# Patient Record
Sex: Male | Born: 1965 | Race: White | Hispanic: No | Marital: Married | Smoking: Current every day smoker
Health system: Southern US, Community
[De-identification: ages and names within clinical notes are randomized; demographics above are authoritative.]

## PROBLEM LIST (undated history)

## (undated) DIAGNOSIS — F329 Major depressive disorder, single episode, unspecified: Secondary | ICD-10-CM

## (undated) DIAGNOSIS — IMO0002 Reserved for concepts with insufficient information to code with codable children: Secondary | ICD-10-CM

## (undated) DIAGNOSIS — F419 Anxiety disorder, unspecified: Secondary | ICD-10-CM

## (undated) DIAGNOSIS — K08109 Complete loss of teeth, unspecified cause, unspecified class: Secondary | ICD-10-CM

## (undated) DIAGNOSIS — K219 Gastro-esophageal reflux disease without esophagitis: Secondary | ICD-10-CM

## (undated) DIAGNOSIS — J45909 Unspecified asthma, uncomplicated: Secondary | ICD-10-CM

## (undated) DIAGNOSIS — F32A Depression, unspecified: Secondary | ICD-10-CM

## (undated) DIAGNOSIS — M1990 Unspecified osteoarthritis, unspecified site: Secondary | ICD-10-CM

## (undated) DIAGNOSIS — Z972 Presence of dental prosthetic device (complete) (partial): Secondary | ICD-10-CM

## (undated) DIAGNOSIS — Z9884 Bariatric surgery status: Secondary | ICD-10-CM

## (undated) DIAGNOSIS — I10 Essential (primary) hypertension: Secondary | ICD-10-CM

## (undated) DIAGNOSIS — F17200 Nicotine dependence, unspecified, uncomplicated: Secondary | ICD-10-CM

## (undated) HISTORY — PX: MULTIPLE TOOTH EXTRACTIONS: SHX2053

## (undated) HISTORY — PX: GASTRIC BYPASS: SHX52

## (undated) HISTORY — PX: CARPAL TUNNEL RELEASE: SHX101

## (undated) HISTORY — PX: ROTATOR CUFF REPAIR: SHX139

---

## 1998-05-15 ENCOUNTER — Emergency Department (HOSPITAL_COMMUNITY): Admission: EM | Admit: 1998-05-15 | Discharge: 1998-05-15 | Payer: Self-pay | Admitting: Emergency Medicine

## 1998-06-21 ENCOUNTER — Encounter: Payer: Self-pay | Admitting: Emergency Medicine

## 1998-06-21 ENCOUNTER — Emergency Department (HOSPITAL_COMMUNITY): Admission: EM | Admit: 1998-06-21 | Discharge: 1998-06-21 | Payer: Self-pay | Admitting: Emergency Medicine

## 1998-06-24 ENCOUNTER — Encounter: Admission: RE | Admit: 1998-06-24 | Discharge: 1998-09-22 | Payer: Self-pay | Admitting: Internal Medicine

## 1998-06-26 ENCOUNTER — Encounter: Admission: RE | Admit: 1998-06-26 | Discharge: 1998-06-26 | Payer: Self-pay | Admitting: *Deleted

## 1998-08-09 ENCOUNTER — Emergency Department (HOSPITAL_COMMUNITY): Admission: EM | Admit: 1998-08-09 | Discharge: 1998-08-09 | Payer: Self-pay | Admitting: Emergency Medicine

## 1998-09-16 ENCOUNTER — Inpatient Hospital Stay (HOSPITAL_COMMUNITY): Admission: EM | Admit: 1998-09-16 | Discharge: 1998-09-18 | Payer: Self-pay | Admitting: Endocrinology

## 1998-09-16 ENCOUNTER — Encounter: Payer: Self-pay | Admitting: Endocrinology

## 1999-01-21 ENCOUNTER — Encounter: Payer: Self-pay | Admitting: Emergency Medicine

## 1999-01-21 ENCOUNTER — Emergency Department (HOSPITAL_COMMUNITY): Admission: EM | Admit: 1999-01-21 | Discharge: 1999-01-21 | Payer: Self-pay | Admitting: Internal Medicine

## 1999-01-22 ENCOUNTER — Inpatient Hospital Stay (HOSPITAL_COMMUNITY): Admission: EM | Admit: 1999-01-22 | Discharge: 1999-01-24 | Payer: Self-pay | Admitting: Emergency Medicine

## 1999-04-10 ENCOUNTER — Emergency Department (HOSPITAL_COMMUNITY): Admission: EM | Admit: 1999-04-10 | Discharge: 1999-04-10 | Payer: Self-pay | Admitting: Emergency Medicine

## 1999-04-10 ENCOUNTER — Encounter: Payer: Self-pay | Admitting: Emergency Medicine

## 1999-04-11 ENCOUNTER — Emergency Department (HOSPITAL_COMMUNITY): Admission: EM | Admit: 1999-04-11 | Discharge: 1999-04-11 | Payer: Self-pay | Admitting: Emergency Medicine

## 1999-04-27 ENCOUNTER — Emergency Department (HOSPITAL_COMMUNITY): Admission: EM | Admit: 1999-04-27 | Discharge: 1999-04-27 | Payer: Self-pay | Admitting: Emergency Medicine

## 1999-09-22 ENCOUNTER — Encounter: Payer: Self-pay | Admitting: Internal Medicine

## 1999-09-22 ENCOUNTER — Emergency Department (HOSPITAL_COMMUNITY): Admission: EM | Admit: 1999-09-22 | Discharge: 1999-09-22 | Payer: Self-pay | Admitting: Internal Medicine

## 2000-05-05 ENCOUNTER — Emergency Department (HOSPITAL_COMMUNITY): Admission: EM | Admit: 2000-05-05 | Discharge: 2000-05-05 | Payer: Self-pay | Admitting: Emergency Medicine

## 2000-05-17 ENCOUNTER — Encounter: Payer: Self-pay | Admitting: Emergency Medicine

## 2000-05-17 ENCOUNTER — Emergency Department (HOSPITAL_COMMUNITY): Admission: EM | Admit: 2000-05-17 | Discharge: 2000-05-17 | Payer: Self-pay | Admitting: Emergency Medicine

## 2000-12-19 ENCOUNTER — Emergency Department (HOSPITAL_COMMUNITY): Admission: EM | Admit: 2000-12-19 | Discharge: 2000-12-19 | Payer: Self-pay | Admitting: Emergency Medicine

## 2001-02-12 ENCOUNTER — Emergency Department (HOSPITAL_COMMUNITY): Admission: EM | Admit: 2001-02-12 | Discharge: 2001-02-13 | Payer: Self-pay | Admitting: Emergency Medicine

## 2001-02-13 ENCOUNTER — Encounter: Payer: Self-pay | Admitting: Emergency Medicine

## 2002-02-25 ENCOUNTER — Emergency Department (HOSPITAL_COMMUNITY): Admission: EM | Admit: 2002-02-25 | Discharge: 2002-02-25 | Payer: Self-pay | Admitting: Emergency Medicine

## 2002-06-30 ENCOUNTER — Emergency Department (HOSPITAL_COMMUNITY): Admission: EM | Admit: 2002-06-30 | Discharge: 2002-06-30 | Payer: Self-pay | Admitting: Emergency Medicine

## 2003-10-06 HISTORY — PX: SHOULDER ARTHROSCOPY: SHX128

## 2004-03-28 ENCOUNTER — Encounter: Admission: RE | Admit: 2004-03-28 | Discharge: 2004-03-28 | Payer: Self-pay | Admitting: Neurosurgery

## 2004-03-28 ENCOUNTER — Ambulatory Visit (HOSPITAL_COMMUNITY): Admission: RE | Admit: 2004-03-28 | Discharge: 2004-03-28 | Payer: Self-pay | Admitting: Neurosurgery

## 2004-04-21 ENCOUNTER — Emergency Department (HOSPITAL_COMMUNITY): Admission: EM | Admit: 2004-04-21 | Discharge: 2004-04-21 | Payer: Self-pay | Admitting: Emergency Medicine

## 2004-05-21 ENCOUNTER — Ambulatory Visit (HOSPITAL_COMMUNITY): Admission: RE | Admit: 2004-05-21 | Discharge: 2004-05-22 | Payer: Self-pay | Admitting: Orthopedic Surgery

## 2004-10-05 HISTORY — PX: SKIN GRAFT FULL THICKNESS LEG: SUR1299

## 2004-10-05 HISTORY — PX: GASTRIC BYPASS: SHX52

## 2004-10-05 HISTORY — PX: CARPAL TUNNEL RELEASE: SHX101

## 2005-01-27 ENCOUNTER — Encounter: Admission: RE | Admit: 2005-01-27 | Discharge: 2005-04-27 | Payer: Self-pay | Admitting: *Deleted

## 2005-01-28 ENCOUNTER — Ambulatory Visit: Payer: Self-pay | Admitting: Psychology

## 2005-02-16 ENCOUNTER — Ambulatory Visit: Payer: Self-pay | Admitting: Psychology

## 2005-02-18 ENCOUNTER — Ambulatory Visit (HOSPITAL_COMMUNITY): Admission: RE | Admit: 2005-02-18 | Discharge: 2005-02-18 | Payer: Self-pay | Admitting: *Deleted

## 2005-02-20 ENCOUNTER — Ambulatory Visit: Payer: Self-pay | Admitting: Psychology

## 2005-03-18 ENCOUNTER — Ambulatory Visit (HOSPITAL_BASED_OUTPATIENT_CLINIC_OR_DEPARTMENT_OTHER): Admission: RE | Admit: 2005-03-18 | Discharge: 2005-03-18 | Payer: Self-pay | Admitting: Orthopedic Surgery

## 2005-03-18 ENCOUNTER — Ambulatory Visit (HOSPITAL_COMMUNITY): Admission: RE | Admit: 2005-03-18 | Discharge: 2005-03-18 | Payer: Self-pay | Admitting: Orthopedic Surgery

## 2005-03-25 ENCOUNTER — Ambulatory Visit: Payer: Self-pay | Admitting: Psychology

## 2005-04-28 ENCOUNTER — Encounter: Admission: RE | Admit: 2005-04-28 | Discharge: 2005-07-27 | Payer: Self-pay | Admitting: *Deleted

## 2005-05-12 ENCOUNTER — Inpatient Hospital Stay (HOSPITAL_COMMUNITY): Admission: RE | Admit: 2005-05-12 | Discharge: 2005-05-15 | Payer: Self-pay | Admitting: *Deleted

## 2005-05-16 ENCOUNTER — Inpatient Hospital Stay (HOSPITAL_COMMUNITY): Admission: EM | Admit: 2005-05-16 | Discharge: 2005-05-18 | Payer: Self-pay | Admitting: Emergency Medicine

## 2005-07-19 ENCOUNTER — Emergency Department (HOSPITAL_COMMUNITY): Admission: EM | Admit: 2005-07-19 | Discharge: 2005-07-19 | Payer: Self-pay | Admitting: Emergency Medicine

## 2005-07-31 ENCOUNTER — Inpatient Hospital Stay (HOSPITAL_COMMUNITY): Admission: RE | Admit: 2005-07-31 | Discharge: 2005-08-06 | Payer: Self-pay | Admitting: Plastic Surgery

## 2005-08-19 ENCOUNTER — Encounter: Admission: RE | Admit: 2005-08-19 | Discharge: 2005-11-17 | Payer: Self-pay | Admitting: *Deleted

## 2005-12-16 ENCOUNTER — Emergency Department (HOSPITAL_COMMUNITY): Admission: EM | Admit: 2005-12-16 | Discharge: 2005-12-17 | Payer: Self-pay | Admitting: Emergency Medicine

## 2005-12-25 ENCOUNTER — Emergency Department (HOSPITAL_COMMUNITY): Admission: EM | Admit: 2005-12-25 | Discharge: 2005-12-25 | Payer: Self-pay | Admitting: Emergency Medicine

## 2006-02-13 ENCOUNTER — Emergency Department (HOSPITAL_COMMUNITY): Admission: EM | Admit: 2006-02-13 | Discharge: 2006-02-13 | Payer: Self-pay | Admitting: Emergency Medicine

## 2006-03-14 ENCOUNTER — Emergency Department (HOSPITAL_COMMUNITY): Admission: EM | Admit: 2006-03-14 | Discharge: 2006-03-15 | Payer: Self-pay | Admitting: Emergency Medicine

## 2007-06-18 ENCOUNTER — Inpatient Hospital Stay (HOSPITAL_COMMUNITY): Admission: AD | Admit: 2007-06-18 | Discharge: 2007-06-22 | Payer: Self-pay | Admitting: Psychiatry

## 2007-06-20 ENCOUNTER — Ambulatory Visit: Payer: Self-pay | Admitting: Psychiatry

## 2007-08-04 ENCOUNTER — Emergency Department (HOSPITAL_COMMUNITY): Admission: EM | Admit: 2007-08-04 | Discharge: 2007-08-04 | Payer: Self-pay | Admitting: Emergency Medicine

## 2008-10-05 HISTORY — PX: CARPAL TUNNEL RELEASE: SHX101

## 2008-11-06 ENCOUNTER — Ambulatory Visit (HOSPITAL_BASED_OUTPATIENT_CLINIC_OR_DEPARTMENT_OTHER): Admission: RE | Admit: 2008-11-06 | Discharge: 2008-11-06 | Payer: Self-pay | Admitting: Orthopedic Surgery

## 2009-03-29 ENCOUNTER — Emergency Department (HOSPITAL_COMMUNITY): Admission: EM | Admit: 2009-03-29 | Discharge: 2009-03-29 | Payer: Self-pay | Admitting: Emergency Medicine

## 2010-10-05 HISTORY — PX: SHOULDER ARTHROSCOPY: SHX128

## 2011-01-12 LAB — BASIC METABOLIC PANEL
BUN: 6 mg/dL (ref 6–23)
Calcium: 8.8 mg/dL (ref 8.4–10.5)
Creatinine, Ser: 0.76 mg/dL (ref 0.4–1.5)
GFR calc non Af Amer: >60 mL/min (ref >60)
Glucose, Bld: 183 mg/dL — ABNORMAL HIGH (ref 70–99)
Potassium: 3.2 mEq/L — ABNORMAL LOW (ref 3.5–5.1)

## 2011-01-12 LAB — DIFFERENTIAL
Basophils Absolute: 0 10*3/uL (ref 0.0–0.1)
Eosinophils Relative: 1 % (ref 0–5)
Lymphocytes Relative: 13 % (ref 12–46)
Lymphs Abs: 1.2 10*3/uL (ref 0.7–4.0)
Neutro Abs: 6.9 10*3/uL (ref 1.7–7.7)
Neutrophils Relative %: 79 % — ABNORMAL HIGH (ref 43–77)

## 2011-01-12 LAB — URINALYSIS, ROUTINE W REFLEX MICROSCOPIC
Bilirubin Urine: NEGATIVE
Ketones, ur: NEGATIVE mg/dL
Nitrite: NEGATIVE
Urobilinogen, UA: 0.2 mg/dL (ref 0.0–1.0)
pH: 5.5 (ref 5.0–8.0)

## 2011-01-12 LAB — D-DIMER, QUANTITATIVE: D-Dimer, Quant: 0.9 ug/mL-FEU — ABNORMAL HIGH (ref 0.00–0.48)

## 2011-01-12 LAB — HEPATIC FUNCTION PANEL
ALT: 23 U/L (ref 0–53)
Albumin: 3.7 g/dL (ref 3.5–5.2)
Alkaline Phosphatase: 106 U/L (ref 39–117)
Bilirubin, Direct: 0.1 mg/dL (ref 0.0–0.3)
Total Protein: 6.6 g/dL (ref 6.0–8.3)

## 2011-01-12 LAB — CBC
HCT: 42.7 % (ref 39.0–52.0)
Platelets: 299 10*3/uL (ref 150–400)
RDW: 12.1 % (ref 11.5–15.5)
WBC: 8.7 10*3/uL (ref 4.0–10.5)

## 2011-01-12 LAB — TROPONIN I: Troponin I: 0.02 ng/mL (ref 0.00–0.06)

## 2011-01-12 LAB — LIPASE, BLOOD: Lipase: 20 U/L (ref 11–59)

## 2011-01-20 LAB — GLUCOSE, CAPILLARY
Glucose-Capillary: 223 mg/dL — ABNORMAL HIGH (ref 70–99)
Glucose-Capillary: 247 mg/dL — ABNORMAL HIGH (ref 70–99)

## 2011-01-20 LAB — BASIC METABOLIC PANEL
BUN: 14 mg/dL (ref 6–23)
Chloride: 94 mEq/L — ABNORMAL LOW (ref 96–112)
Potassium: 4.2 mEq/L (ref 3.5–5.1)
Sodium: 133 mEq/L — ABNORMAL LOW (ref 135–145)

## 2011-01-31 ENCOUNTER — Emergency Department (HOSPITAL_COMMUNITY): Payer: Self-pay

## 2011-01-31 ENCOUNTER — Emergency Department (HOSPITAL_COMMUNITY)
Admission: EM | Admit: 2011-01-31 | Discharge: 2011-01-31 | Disposition: A | Discharge status: Home / Self Care | Payer: Self-pay | Attending: Emergency Medicine | Admitting: Emergency Medicine

## 2011-01-31 DIAGNOSIS — R110 Nausea: Secondary | ICD-10-CM | POA: Insufficient documentation

## 2011-01-31 DIAGNOSIS — G8929 Other chronic pain: Secondary | ICD-10-CM | POA: Insufficient documentation

## 2011-01-31 DIAGNOSIS — Z79899 Other long term (current) drug therapy: Secondary | ICD-10-CM | POA: Insufficient documentation

## 2011-01-31 DIAGNOSIS — I10 Essential (primary) hypertension: Secondary | ICD-10-CM | POA: Insufficient documentation

## 2011-01-31 DIAGNOSIS — R10819 Abdominal tenderness, unspecified site: Secondary | ICD-10-CM | POA: Insufficient documentation

## 2011-01-31 DIAGNOSIS — Z9884 Bariatric surgery status: Secondary | ICD-10-CM | POA: Insufficient documentation

## 2011-01-31 DIAGNOSIS — R1013 Epigastric pain: Secondary | ICD-10-CM | POA: Insufficient documentation

## 2011-01-31 LAB — URINALYSIS, ROUTINE W REFLEX MICROSCOPIC
Bilirubin Urine: NEGATIVE
Glucose, UA: NEGATIVE mg/dL
Hgb urine dipstick: NEGATIVE
Ketones, ur: NEGATIVE mg/dL
Nitrite: NEGATIVE
Protein, ur: NEGATIVE mg/dL
Specific Gravity, Urine: 1.023 (ref 1.005–1.030)
Urobilinogen, UA: 0.2 mg/dL (ref 0.0–1.0)
pH: 5.5 (ref 5.0–8.0)

## 2011-01-31 LAB — COMPREHENSIVE METABOLIC PANEL
CO2: 29 mEq/L (ref 19–32)
Calcium: 8.6 mg/dL (ref 8.4–10.5)
Creatinine, Ser: 0.94 mg/dL (ref 0.4–1.5)
GFR calc Af Amer: >60 mL/min (ref >60)
GFR calc non Af Amer: >60 mL/min (ref >60)
Glucose, Bld: 111 mg/dL — ABNORMAL HIGH (ref 70–99)
Sodium: 141 mEq/L (ref 135–145)
Total Protein: 6.3 g/dL (ref 6.0–8.3)

## 2011-01-31 LAB — CBC
HCT: 38.7 % — ABNORMAL LOW (ref 39.0–52.0)
Hemoglobin: 13.9 g/dL (ref 13.0–17.0)
MCH: 31.5 pg (ref 26.0–34.0)
MCHC: 35.9 g/dL (ref 30.0–36.0)
MCV: 87.8 fL (ref 78.0–100.0)
Platelets: 280 10*3/uL (ref 150–400)
RBC: 4.41 MIL/uL (ref 4.22–5.81)
RDW: 11.9 % (ref 11.5–15.5)
WBC: 9.4 10*3/uL (ref 4.0–10.5)

## 2011-01-31 LAB — DIFFERENTIAL
Basophils Absolute: 0.1 10*3/uL (ref 0.0–0.1)
Basophils Relative: 1 % (ref 0–1)
Eosinophils Absolute: 0.2 10*3/uL (ref 0.0–0.7)
Eosinophils Relative: 2 % (ref 0–5)
Lymphocytes Relative: 24 % (ref 12–46)
Lymphs Abs: 2.3 K/uL (ref 0.7–4.0)
Monocytes Absolute: 0.5 10*3/uL (ref 0.1–1.0)
Monocytes Relative: 5 % (ref 3–12)
Neutro Abs: 6.5 K/uL (ref 1.7–7.7)
Neutrophils Relative %: 69 % (ref 43–77)

## 2011-01-31 LAB — OCCULT BLOOD, POC DEVICE: Fecal Occult Bld: NEGATIVE

## 2011-01-31 LAB — COMPREHENSIVE METABOLIC PANEL WITH GFR
ALT: 18 U/L (ref 0–53)
AST: 16 U/L (ref 0–37)
Albumin: 3.4 g/dL — ABNORMAL LOW (ref 3.5–5.2)
Alkaline Phosphatase: 69 U/L (ref 39–117)
BUN: 14 mg/dL (ref 6–23)
Chloride: 105 meq/L (ref 96–112)
Potassium: 3.9 meq/L (ref 3.5–5.1)
Total Bilirubin: 0.5 mg/dL (ref 0.3–1.2)

## 2011-01-31 LAB — LIPASE, BLOOD: Lipase: 54 U/L (ref 11–59)

## 2011-02-17 NOTE — Discharge Summary (Signed)
Timothy [name], Timothy [name]               ACCOUNT NO.:  [id number]   MEDICAL RECORD NO.:  [id number]          PATIENT TYPE:  IPS   LOCATION:  0503                          FACILITY:  [facility]   PHYSICIAN:  Geoffery Lyons, M.D.      DATE OF BIRTH:  [birth year]   DATE OF ADMISSION:  06/18/2007  DATE OF DISCHARGE:  06/22/2007                               DISCHARGE SUMMARY   CHIEF COMPLAINT AND PRESENT ILLNESS:  This was the first admission to  [facility] for this 45 year old married white male  presented as walk-in requesting detox from alcohol.  He went to his  family physician and he was encouraged to go to the [facility] ED on Friday  after he had stopped drinking alcohol on Thursday.  After a lengthy  evaluation in the ED, he was told that he did not actually meet the  inpatient criteria and was discharged.  The day before this admission,  he felt woozy, afraid he was going to have a seizure.  He went and  grabbed a beer and came to [facility].   PAST PSYCHIATRIC HISTORY:  No prior inpatient treatment.  Has one  outpatient visit at [facility].   ALCOHOL/DRUG HISTORY:  Began using alcohol at age 27.  This escalated  after his gastric bypass in 2006.  He had a tequila at a party.  He  realized it was an instant rush and worked himself up to about a fifth  of tequila a day.  Back in January, realized that his alcohol was  problematic.  Was afraid of being fired and hence he went to beer,  drinking 14-24 12-ounce beers a day.  He had a DUI back in April.   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   MEDICATIONS:  Lexapro 20 mg per day, furosemide 80 mg per day,  metoprolol ER 50 mg in the morning and at night, Singulair 10 mg per  day, ACTOplus met 15/500 mg twice a day, alprazolam ER 1 mg twice a day  and 0.5 mg twice a day as needed.   PHYSICAL  EXAMINATION:  Performed and failed to show any acute findings.   LABORATORY DATA:  White blood cells 4.5, hemoglobin 13.6.  Sodium 142,  potassium 3.8, glucose 116.  SGOT 26, SGPT 23, total bilirubin 0.6.   MENTAL STATUS EXAM:  Alert cooperative male, appropriately groomed,  dressed and nourished.  Good eye contact.  Speech was normal rate,  rhythm and production.  Mood was anxious.  Affect was broad.  Thought  processes are clear, rational and goal-oriented.  No delusions.  No  active suicidal or homicidal ideation.  No hallucinations.  Cognition  well-preserved.   ADMISSION DIAGNOSES:  AXIS I:  Alcohol dependence.  Alcohol withdrawal.  Anxiety disorder not otherwise specified.  Rule out depressive disorder  not otherwise specified.  AXIS II:  No diagnosis.  AXIS III:  Non-insulin-dependent diabetes mellitus, asthma,  hypertension, palpitations, ruptured C6-C7, resulting in chronic neck  pain and  scoliosis and tremor.  AXIS IV:  Moderate.  AXIS V:  GAF upon admission 35; highest GAF in the last year 60.   HOSPITAL COURSE:  He was admitted.  He was started in individual and  group psychotherapy.  He was detoxified with Librium.  He was maintained  on the Lexapro 20 mg per day, furosemide 80 mg per day, metoprolol ER  100 mg per day, Singulair 10 mg per day, ACTOplus met 15/850 mg twice a  day.  As already stated, he endorsed that he started drinking in 2006  after he went through a gastric bypass surgery.  Started using tequila.  It got to him and he felt it and liked it.  Increased tolerance,  switched to beer, 24 beers per day.  Was on Xanax and was drinking with  it.  He was also using some opiates.  Says life has become his going to  work, get home, drink, pass out, then get up in the morning, work,  drink, pass out.  Confronted by his wife who basically told him that she  was not going to put up with him doing this over again.  He also had a  DUI in April.  He also endorsed  panic attacks there even when he was not  drinking.  Detox went uneventfully.  Continued to endorse that he was  committed to abstinence.  Still underlying depression.  Requested an  adjustment on the Lexapro which was reasonable.  Was sleeping with the  Ambien.  Endorsed a restful sleep.  On June 22, 2007, he was in  full contact with reality.  There were no active suicidal or homicidal  ideation.  No hallucinations.  No delusions.  He was willing and  motivated to pursue further outpatient treatment for what we went ahead  and discharged to outpatient follow-up.   DISCHARGE DIAGNOSES:  AXIS I:  Alcohol dependence.  Anxiety disorder not  otherwise specified.  Depressive disorder not otherwise specified.  AXIS II:  No diagnosis.  AXIS III:  Non-insulin-dependent diabetes mellitus, asthma,  hypertension, palpitations, ruptured C6-C7, chronic neck pain,  scoliosis.  AXIS IV:  Moderate.  AXIS V:  GAF upon discharge 55-60.   DISCHARGE MEDICATIONS:  1. Lexapro 30 mg per day.  2. Lasix 80 mg per day.  3. Singulair 10 mg at bedtime.  4. Toprol XL 50 mg twice a day.  5. Aspirin 81 mg per day.  6. Voltaren 75 mg twice a day.  7. Ambien 10 mg at bedtime for sleep.  8. ACTOplus met 15/850 mg, 1 twice a day.   FOLLOW UP:  Dr. Lolly Mustache at [facility].      Geoffery Lyons, M.D.  Electronically Signed     IL/MEDQ  D:  07/12/2007  T:  07/13/2007  Job:  [id number]

## 2011-02-17 NOTE — H&P (Signed)
NAMEGRAYDEN, Timothy [name]               ACCOUNT NO.:  [id number]   MEDICAL RECORD NO.:  [id number]          PATIENT TYPE:  IPS   LOCATION:  0503                          [facility]ACILITY[facility]  [facility]   PHYSICIAN:  Geoffery Lyons, M.D.      DATE OF BIRTH:  11/26/1965   DATE OF ADMISSION:  06/18/2007  DATE OF DISCHARGE:                       PSYCHIATRIC ADMISSION ASSESSMENT   IDENTIFYING INFORMATION:  This is a voluntary admission to the services  of Dr. Geoffery Lyons.  This is a 45 year old married white male.  Mr.  Thrall presented as a walk-in yesterday requesting detox from alcohol.  He stated he went to his family physician, Dr. Dartha Lodge, on  Wednesday and he was encouraged to go to the [facility] ED on Friday after  he had stopped drinking alcohol on Thursday.  However, after a lengthy  evaluation in the emergency department, he was told that he did not  actually meet their inpatient criteria and was discharged.  Yesterday,  he felt woozy.  He was afraid that he would have a seizure and, hence,  he went and grabbed a beer and then came on here to the evaluation  center here at [facility].  He was admitted.  He was  started on the low dose Librium protocol.  Today, he states he feels a  lot better although he is still having some hand tremor.   PAST PSYCHIATRIC HISTORY:  He has had no prior inpatient.  He had one  outpatient visit at [facility].   SOCIAL HISTORY:  He has a B.S. in emergency medicine.  This is his  second marriage.  It has lasted 13 years.  They have no children.  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   FAMILY HISTORY:  He has a cousin who suicided by gunshot wound and  maternal uncles have alcohol issues as well as his father and he also  has a cousin who uses alcohol and crack.   ALCOHOL/DRUG HISTORY:  He began using alcohol at age 34.  However, this  escalated after his gastric bypass in 2006.  He had tequila at a  party,  realized it was an instant rush and worked himself up to about a fifth  of tequila a day.  Back in January, he realized that his alcohol use was  problematic.  He was afraid of being fired and hence he went to beer.  He has been drinking 14-24 12-ounce beers a day and did indeed have a  DUI back in April.  His court date is upcoming for that.   PRIMARY CARE PHYSICIAN:  Dr. Dartha Lodge.   MEDICAL PROBLEMS:  Non-insulin-dependent diabetes mellitus, asthma,  hypertension, palpitations, chronic neck pain due to ruptured C6-7 and  scoliosis.   MEDICATIONS:  He is currently prescribed Lexapro 20 mg p.o. q.d.,  furosemide 80 mg p.o. q.d., metoprolol ER 50 mg a.m. and h.s., Singulair  10 mg p.o. q.d., ACTOplus ET 15/500 mg b.i.d., alprazolam ER at 1 mg  p.o. b.i.d. and alprazolam 0.5 mg p.o. b.i.d. p.r.n.   ALLERGIES:  PENICILLIN,  IVP DYE and SULFA.   POSITIVE PHYSICAL FINDINGS:  He has managed to lose 198 pounds since his  gastric surgery bypass.   LABORATORY DATA:  Not all of his labs are available yet.  His glucose is  still slightly elevated at 116.   The remainder of his physical examination is unremarkable.  He is 5 feet  9 inches, weighs 201 pounds, temperature is 98.1, blood pressure 139/86,  pulse 89, respirations 22.  He does have some slight hand tremor, right  hand greater than left.  He states that he always has some right tremor.  This is due to his ruptured disk.   MENTAL STATUS EXAM:  Today, he is alert and oriented x3.  He is  appropriately groomed, dressed and nourished.  He has good eye contact.  His speech is a normal rate, rhythm and tone.  His mood is appropriate  to the situation.  His affect has a normal range.  Thought processes are  clear, rational and goal-oriented.  Judgment and insight are intact.  Concentration and memory are intact.  Intelligence is at least average.  He denies suicidal or homicidal ideation.  He denies auditory or visual   hallucinations.   DIAGNOSES:  AXIS I:  Alcohol dependence.  Alcohol withdrawal.  Anxiety  disorder not otherwise specified.  Depressive disorder not otherwise  specified.  AXIS II:  Deferred.  AXIS III:  Non-insulin-dependent diabetes mellitus, asthma,  hypertension, palpitations, ruptured C6-7 resulting in chronic neck  pain, scoliosis, tremor.  AXIS IV:  Legal, he is status post DUI April of 2008 with an upcoming  court date July 12, 2007.  AXIS V:  40.   PLAN:  To admit for detoxification from alcohol.  Toward that end, he  was started on the low-dose Librium protocol.  He stated that he had  actually had a discussion regarding Antabuse with his primary care  physician and we will leave that to the attending psychiatrist to see if  they want to start that or not while he is hospitalized.      Mickie Leonarda Salon, P.A.-C.      Geoffery Lyons, M.D.  Electronically Signed    MD/MEDQ  D:  06/19/2007  T:  06/19/2007  Job:  [id number]

## 2011-02-17 NOTE — Op Note (Signed)
Timothy [name], ELPERS               ACCOUNT NO.:  [id number]   MEDICAL RECORD NO.:  [id number]          PATIENT TYPE:  AMB   LOCATION:  DSC                          FACILITY:  [facility]   PHYSICIAN:  [name], M.D.       DATE OF BIRTH:  1965/12/18   DATE OF PROCEDURE:  11/06/2008  DATE OF DISCHARGE:                               OPERATIVE REPORT   PREOPERATIVE DIAGNOSIS:  Carpal tunnel syndrome, right hand.   POSTOPERATIVE DIAGNOSIS:  Carpal tunnel syndrome, right hand.   OPERATION:  Decompression, right median nerve.   SURGEON:  [name], MD   ASSISTANT:  Joaquin Courts, RN   ANESTHESIA:  Forearm based IV regional.   ANESTHESIOLOGIST:  Sheldon Silvan, MD   HISTORY:  The patient is a 45 year old male with a history of carpal  tunnel syndrome, EMG nerve conductions positive which has not responded  to conservative treatment.  He has elected to undergo surgical  decompression.  Pre, peri, and postoperative course were discussed along  with risks and complications.  He is aware that there is no guarantee  with the surgery, possibility of infection, recurrence of injury to  arteries, nerves, tendons, complete relief of symptoms, or dystrophy.  In the preoperative area, the patient is seen, the extremity marked by  both the patient and surgeon, antibiotic given, and questions were  answered to his satisfaction.   PROCEDURE IN DETAIL:  The patient was brought to the operating room  where a forearm based IV regional anesthetic was carried out without  difficulty.  He was prepped using DuraPrep, supine position, right arm  free.  He had some movement.  Time-out was taken.  An incision was made.  He continued to show movement.  The area was infiltrated with 0.25%  Marcaine without epinephrine.  The incision was deepened down to the  palmar fascia.  This was split.  The superficial palmar arch was  identified.  Flexor tendon of the ring and little finger identified to  the ulnar side of  median nerve.  The carpal retinaculum was incised with  sharp dissection.  Right angle and Sewall retractor were placed between  skin and forearm fascia.  The fascia was released for approximately 1.5  cm proximal to the wrist crease under direct vision.  Canal was  explored.  The area of compression of the nerve was apparent and no  further lesions were identified.  The wound was irrigated.  Skin closed  with interrupted 5-0 Vicryl Rapide  sutures.  A sterile compressive dressing with splint to the wrist was  applied.  The patient tolerated the procedure well and was taken to the  recovery room for observation in satisfactory condition.  He will be  discharged home to return to the [facility] in 1 week on  Vicodin.           ______________________________  [name], M.D.     GK/MEDQ  D:  11/06/2008  T:  11/06/2008  Job:  [id number]   cc:   [facility]

## 2011-02-20 NOTE — Op Note (Signed)
NAMEDAQUARIUS, DUBEAU               ACCOUNT NO.:  [id number]   MEDICAL RECORD NO.:  [id number]          PATIENT TYPE:  INP   LOCATION:  0002                         [facility]  [facility]   PHYSICIAN:  Vikki Ports, MDDATE OF BIRTH:  25-Dec-1965   DATE OF PROCEDURE:  05/12/2005  DATE OF DISCHARGE:                                 OPERATIVE REPORT   PREOPERATIVE DIAGNOSIS:  Morbid obesity with multiple comorbid conditions.   POSTOPERATIVE DIAGNOSIS:  Morbid obesity with multiple comorbid conditions.   PROCEDURE:  Laparoscopic Roux-en-Y gastric bypass, left-facing Roux limb.   SURGEON:  Vikki Ports, M.D.   ASSISTANT:  Sharlet Salina T. Hoxworth, M.D.   DESCRIPTION:  The patient was taken to the operating room and placed in a  supine position.  After adequate general anesthesia was induced using  endotracheal tube, the abdomen was prepped and draped in a normal sterile  fashion.  Using an 11 mm Optiview technique, peritoneal access was obtained  in the left upper quadrant.  The additional trocars were placed under direct  vision throughout the abdominal cavity.  The ligament of Treitz was  identified, and the proximal jejunum was transected using a 60 mm GIA  stapling device 40 cm distal to the ligament of Treitz.  I then counted 100  cm after marking the Roux limb with a Penrose drain, and a side-to-side  jejunojejunostomy was performed in a standard fashion.  The mesenteric  defect was closed with a running 2-0 silk suture.  DICTATION ENDED AT THIS  POINT       KRH/MEDQ  D:  05/12/2005  T:  05/12/2005  Job:  [id number]

## 2011-02-20 NOTE — Op Note (Signed)
NAMEROSEMARY, Timothy [name]               ACCOUNT NO.:  [id number]   MEDICAL RECORD NO.:  [id number]          PATIENT TYPE:  INP   LOCATION:  5705                         FACILITY:  [facility]   PHYSICIAN:  Etter Sjogren, M.D.     DATE OF BIRTH:  [birth year]   DATE OF PROCEDURE:  07/31/2005  DATE OF DISCHARGE:                                 OPERATIVE REPORT   PREOPERATIVE DIAGNOSIS:  Complicated open wound of the left lower leg.   POSTOPERATIVE DIAGNOSIS:  Complicated open wound of the left lower leg.   PROCEDURE PERFORMED:  1.  Preparation of recipient site as a separate procedure.  2.  Split thickness skin graft, left leg.  3.  Placement of a VAC assistive closure device as a separate distinct      procedure.   SURGEON:  Etter Sjogren, M.D.   ANESTHESIA:  General.   ESTIMATED BLOOD LOSS:  Minimal.   CLINICAL NOTE:  45 year old man has had dog bite injuries to his left leg.  These have improved but he still has a fairly large open wound of his left  leg that requires skin grafting.  The nature of the procedure and risks were  discussed with him including the possibility of failure of the graft.  He is  at increased risk due to his weight as well as his diabetes.  He understands  the risks and possible complications and wishes to proceed.   DESCRIPTION OF PROCEDURE:  The patient was taken to the operating room and  placed supine.  After successful induction of anesthesia, he was prepped  with Betadine and draped with sterile drapes.  The wound was then debrided  very carefully and irrigated with 3 liters of saline using the pulse lavage  system.  The split thickness skin graft was then harvested from the right  thigh and meshed 1.5 to 1.  It was applied to the wound bed and secured with  skin staples.  A dressing with Adaptic and a VAC closure device was placed.  The donor site was dressed with Xeroform gauze, Adaptic, 4 by 4s, ABD.  He  was transferred to the recovery room in stable  condition having tolerated  the procedure well.      Etter Sjogren, M.D.  Electronically Signed     DB/MEDQ  D:  07/31/2005  T:  07/31/2005  Job:  [id number]

## 2011-02-20 NOTE — Discharge Summary (Signed)
NAMEDINH, AYOTTE               ACCOUNT NO.:  [id number]   MEDICAL RECORD NO.:  [id number]          PATIENT TYPE:  INP   LOCATION:  5704                         FACILITY:  [facility]   PHYSICIAN:  Etter Sjogren, M.D.     DATE OF BIRTH:  Sep 01, 1966   DATE OF ADMISSION:  07/31/2005  DATE OF DISCHARGE:                                 DISCHARGE SUMMARY   FINAL DIAGNOSIS:  Complicated wound of the left leg.   PROCEDURE PERFORMED:  Preparation of the site and split thickness skin  grafting as well as application of a VAC.   SUMMARY OF HISTORY AND PHYSICAL:  This is a 45 year old man who was a bitten  by a dog. He had a severe injury to his left leg. He is diabetic. He had a  fairly large wound that would not heal very well and it was felt that skin  graft was indicated.   The procedure and risks were discussed with him and the possibility of skin  graft loss. He understood all this and wished to proceed. For further  details of history and physical, please see the chart. On admission he was  taken to surgery at which time the limb was cleaned, debrided, and a split  thickness skin graft applied. A VAC was placed over this. He tolerated the  procedure well.   Postoperatively, he remained on bedrest with the leg elevated. He was on DVT  prophylaxis. The vac was removed on the 5th postoperative day and the skin  graft looked pretty good. Donor site was treated with a hair dryer and has  dried nicely. No evidence of any infection. On the 6th postoperative day,  the wrap was taken off and the skin graft continued to look very healthy. It  was felt that he was ready to be discharged.   DISPOSITION:  He is to discharged on work restrictions, to keep his leg  elevated, no prolonged standing or lifting, no vigorous activities, and no  shower yet. He is instructed in dressing changes that will be performed  daily.  Discharging him on Percocet, a total of 30, given one to two q.6h.  p.r.n. pain.  Will see him in the office in two weeks or sooner if there are  problems.      Etter Sjogren, M.D.  Electronically Signed     DB/MEDQ  D:  08/06/2005  T:  08/06/2005  Job:  [id number]

## 2011-02-20 NOTE — Op Note (Signed)
NAME:  Timothy [name], Timothy [name]                         ACCOUNT NO.:  [id number]   MEDICAL RECORD NO.:  [id number]                   PATIENT TYPE:  OIB   LOCATION:  3733                                 FACILITY:  [facility]   PHYSICIAN:  Elana Alm. Thurston Hole, M.D.              DATE OF BIRTH:  1966-01-04   DATE OF PROCEDURE:  05/21/2004  DATE OF DISCHARGE:                                 OPERATIVE REPORT   PREOPERATIVE DIAGNOSES:  1. Left shoulder partial rotator cuff tear.  2. Partial labrum tear with impingement.   POSTOPERATIVE DIAGNOSES:  1. Left shoulder partial rotator cuff tear.  2. Partial labrum tear with impingement.   PROCEDURES:  1. Left shoulder examination under anesthesia followed by arthroscopic     partial labrum tear debridement and partial rotator cuff tear     debridement.  2. Left shoulder subacromial decompression.   SURGEON:  Elana Alm. Thurston Hole, M.D.   ASSISTANT:  Julien Girt, P.A.   ANESTHESIA:  General anesthesia.   OPERATIVE TIME:  45 minutes.   COMPLICATIONS:  None.   INDICATIONS FOR PROCEDURE:  Timothy [name] is a 45 year old gentleman who  injured his left shoulder approximately one year ago with a lifting type  injury.  Significant pain with examination.  MRI showing a labrum tear that  has failed conservative care and is now to undergo arthroscopy.   DESCRIPTION OF PROCEDURE:  Timothy [name] is brought to the operating room on  May 21, 2004, placed on the operating table in the supine position.  After an adequate level of general anesthesia was obtained, his left  shoulder was examined under anesthesia.  He had full range of motion and his  shoulder was stable to ligamentous examination.  He was then placed in a  beach chair position and his shoulder and arm were prepped using sterile  DuraPrep and draped using sterile technique.  Originally, through a  posterior arthroscopic portal, the arthroscope with a pump attachment was  placed and through an anterior  portal, an arthroscopic probe was placed.  On  initial inspection, the articular cartilage and the glenohumeral joint was  intact.  There was partial tearing of the superior and anterior labrum, 25  to 30% which was debrided.  The biceps tendon anchor was slightly  hypermobile but it was intact.  The biceps tendon was intact.  Anterior  inferior glenohumeral ligament complex was intact.  The posterior labrum  showed partial tearing 25% and this was debrided.  The rotator cuff showed a  partial tear 30% of the undersurface of the supraspinatus and this was  debrided.  The rest of the rotator cuff was intact.  The inferior capsule  recess was free of pathology.  After this was done, the subacromial space  was entered.  Lateral arthroscopic portal was made.  Moderately thickened  bursitis was resected.  Underneath this, the rotator cuff was somewhat  frayed and inflamed but  it was intact.  Impingement was noted and the  subacromial decompression was carried out removing 6 to 8 mm of the  undersurface of the anterior, anterolateral and anteromedial acromion and CA  ligament release carried out as well.  The [facility] joint showed no significant  impingement into the subacromial space and thus it was not resected.  At  this point, the shoulder could be brought through a full range of motion  with no impingement on the rotator cuff.  At this point, it was felt that  all pathology had been satisfactorily addressed.  Instruments were removed.  Portals were closed with 3-0 nylon suture.  Sterile dressings and a sling  applied.  The patient awakened and taken to the recovery room in stable  condition.   FOLLOW UP CARE:  Timothy [name] will be followed as an overnight observation  patient.  He will be discharged tomorrow if stable.  He will begin early  physical therapy for passive and active assistive range of motion. We will  see him back in the office in a week for sutures out and follow-up.  Needle  and  sponge count was correct x2 at the end of the case.                                               Robert A. Thurston Hole, M.D.    RAW/MEDQ  D:  05/21/2004  T:  05/21/2004  Job:  [id number]

## 2011-02-20 NOTE — Op Note (Signed)
NAMEKUMAR, FALWELL               ACCOUNT NO.:  [id number]   MEDICAL RECORD NO.:  [id number]          PATIENT TYPE:  AMB   LOCATION:  DSC                          FACILITY:  [facility]   PHYSICIAN:  [name], M.D.       DATE OF BIRTH:  1966/08/27   DATE OF PROCEDURE:  03/18/2005  DATE OF DISCHARGE:                                 OPERATIVE REPORT   PREOPERATIVE DIAGNOSIS:  Carpal tunnel syndrome left hand.   POSTOPERATIVE DIAGNOSIS:  Carpal tunnel syndrome left hand.   OPERATION:  Decompression left median nerve.   SURGEON:  [name], M.D.   ASSISTANT:  Carolyne Fiscal, RN   ANESTHESIA:  General.   HISTORY:  The patient is a 45 year old male with a history of carpal tunnel  syndrome.  EMG nerve conduction is positive which has not respond to  conservative treatment.   DESCRIPTION OF PROCEDURE:  The patient was brought to the operating room  where a general anesthetic was carried out without difficulty.  He was  prepped using DuraPrep in the supine position, left arm free. The limb was  exsanguinated with an Esmarch bandage, tourniquet placed on the forearm was  inflated to 225 mmHg.  A straight incision was made longitudinally in the  palm and carried down through subcutaneous tissue. Bleeders were  electrocauterized.   Palmar fascia was split.  Superficial palmar arch identified.  Flexor tendon  of the ring and little finger identified to the ulnar side of the median  nerve.  The carpal retinaculum was incised with sharp dissection.  Right  angle and Sewell retractor placed between skin and forearm fascia. The  fascia was released for approximately 1.5 cm proximal to the wrist crease  under direct vision.  Canal retinaculum was found to be extremely thick and  tight.  Tenosynovial tissue was markedly thickened. No further lesions were  identified. The wound was irrigated. Skin was closed with  interrupted 5-0 nylon sutures. Sterile compressive dressing and splint was  applied. The  patient tolerated the procedure well was taken to recovery room  for observation in satisfactory condition. He is discharged home to return  to the [facility] [place] in 1 week on Vicodin.       GK/MEDQ  D:  03/18/2005  T:  03/18/2005  Job:  [id number]   cc:   [name], M.D.  [address]  [place]  [place] [address]  Fax: [phone]

## 2011-02-20 NOTE — Discharge Summary (Signed)
NAME:  PRESSLEY, TADESSE NO.:  [id number]   MEDICAL RECORD NO.:  [id number]           PATIENT TYPE:   LOCATION:                                 FACILITY:   PHYSICIAN:  Vikki Ports, MD   DATE OF BIRTH:   DATE OF ADMISSION:  DATE OF DISCHARGE:                                 DISCHARGE SUMMARY   ADMISSION DIAGNOSIS:  Morbid obesity.   DISCHARGE DIAGNOSES:  Morbid obesity.   PROCEDURES:  Laparoscopic Roux-en-Y gastric bypass.   CONDITION ON DISCHARGE:  Good and improved.   DISPOSITION:  Discharge to home.   MEDICATIONS:  Roxicet elixir for pain.   FOLLOW-UP:  One week.   HOSPITAL COURSE:  Patient was admitted after home bowel prep.  Underwent  laparoscopic Roux-en-Y gastric bypass.  Postoperatively he did.  His swallow  showed no evidence of leak.  His diet was advanced to postoperative day #1  diet.  By postoperative day #2 he looked well.  His white count remained at  15,000.  He remained afebrile.  Heart rate was in the 90s and on  postoperative day #3 tolerating his diet he was discharged home.  Follow-up  is with me in one week.      Vikki Ports, MD  Electronically Signed     KRH/MEDQ  D:  07/13/2005  T:  07/13/2005  Job:  [id number]

## 2011-02-20 NOTE — H&P (Signed)
Timothy [name], [name]               ACCOUNT NO.:  [id number]   MEDICAL RECORD NO.:  [id number]          PATIENT TYPE:  INP   LOCATION:  0101                         FACILITY:  [facility]   PHYSICIAN:  Velora Heckler, MD      DATE OF BIRTH:  11-30-1965   DATE OF ADMISSION:  05/16/2005  DATE OF DISCHARGE:                                HISTORY & PHYSICAL   INTERVAL HISTORY AND PHYSICAL:   CHIEF COMPLAINT:  Fever, chills, leukocytosis.   HISTORY OF PRESENT ILLNESS:  Timothy [name] is a pleasant 45 year old white  male who underwent laparoscopic gastric bypass by Dr. Danna Hefty on  August 8, 20006.  He was discharged, May 15, 2005, from the surgical  service to home.  Last night he developed onset of fevers, chills, and  sweats.  He presented to the emergency department.  He was found to have a  temperature of 102.5 degrees rectally.  Laboratory studies were drawn and  showed an elevated white blood cell count of 15,000 with 87% neutrophils.  The surgical service was called to evaluate for further recommendations.  A  CT scan of the abdomen and pelvis was requested by the emergency room  physician.   PHYSICAL EXAMINATION:  VITAL SIGNS:  Temp 100.9, blood pressure 154/64,  pulse 94, respirations 20, O2 saturation 96%.  HEENT:  Shows him to be normocephalic, atraumatic.  Sclerae clear.  Conjunctivae clear.  Pupils equal and reactive.  Dentition good.  Mucous  membranes moist.  NECK:  Supple without mass.  Thyroid normal without nodularity.  There is no  lymphadenopathy.  LUNGS:  Clear to auscultation.  CARDIAC:  Shows a regular rate and rhythm without murmur.  ABDOMEN:  Soft, obese.  There is extensive ecchymosis over mainly the right  and lower portions of the abdominal wall.  Surgical wounds appear to be  healing nicely.  Staples remain in place.  There is a small amount of serous  drainage from the mid epigastric port site.  There is no cellulitis.  There  is no purulent fluid.   There is no significant tenderness to palpation.  EXTREMITIES:  Nontender without edema.  Calves are nontender.  There are no  cords.  There is no Homan sign.  The patient does complain of pain in the  left heel but there is no visible sign of abnormality.  NEUROLOGIC:  The patient is alert and oriented without focal deficit.   LABORATORY STUDIES:  In the emergency department:  White count 15.0,  hemoglobin 11.1, platelet count 319,000, differential shows 87% neutrophils,  8% lymphocytes, 5% monocytes, 1% eosinophils.  Electrolytes are normal.  Glucose is slightly elevated at 216.  SGPT is slightly elevated at 67.  Remainder of liver function tests are within normal limits.  Urinalysis is  completely benign.   RADIOGRAPHIC STUDIES:  CT scan abdomen and pelvis pending at time of  dictation.   IMPRESSION:  A 45 year old white male, four days status post laparoscopic  gastric bypass, now with fever and leukocytosis.   PLAN:  1.  Admission [facility].  2.  Check  laboratory studies.  3.  CT scan abdomen and pelvis.  4.  Empiric antibiotic therapy of IV Cipro.       TMG/MEDQ  D:  05/16/2005  T:  05/16/2005  Job:  [id number]   cc:   Vikki Ports, MD  [address]  [place]  [place] [address]

## 2011-02-20 NOTE — Op Note (Signed)
Timothy [name], Timothy [name]               ACCOUNT NO.:  [id number]   MEDICAL RECORD NO.:  [id number]          PATIENT TYPE:  INP   LOCATION:  0002                         FACILITY:  [facility]   PHYSICIAN:  Vikki Ports, MDDATE OF BIRTH:  29-Mar-1966   DATE OF PROCEDURE:  05/12/2005  DATE OF DISCHARGE:                                 OPERATIVE REPORT   PREOPERATIVE DIAGNOSIS:  Morbid obesity with comorbid conditions.   POSTOPERATIVE DIAGNOSIS:  Morbid obesity with comorbid conditions.   PROCEDURE:  Laparoscopic Roux-en-Y gastric bypass.   ANESTHESIA:  General.   SURGEON:  Vikki Ports, MD   ASSISTANT:  Lorne Skeens. Hoxworth, M.D.   ANESTHESIA:  General endotracheal tube.   DESCRIPTION:  The patient was taken to the operating room and placed in a  supine position.  After adequate general anesthesia was induced using  endotracheal tube, the patient's abdomen was prepped and draped in a normal  sterile fashion.  Using an 11 trocar in the left abdomen, pneumoperitoneum  was obtained under direct vision with the Optiview.  Additional 12 mm  trocars were placed under direct vision.  The ligament of Treitz was  identified, and the small bowel was transected using a GIA stapling device,  white load 60 mm, 40 cm from the ligament of Treitz.  The Roux limb was  tagged with a Penrose drain.  Additional mesentery was taken down with a  harmonic scalpel.  A side-to-side jejunojejunostomy was performed in a  standard fashion 100 cm distal to the Roux limb with a GIA white load  stapling device.  The defect was closed with a running 2-0 Vicryl suture.  It was inspected and reinforced with Tisseel.  Mesenteric defect was closed  with a running 2-0 silk suture.   I then turned my attention to the upper abdomen and under direct vision, a  Nathanson liver retractor was placed, and the left lateral segment of the  liver was retracted anteriorly and to the right.  Angle of His was  dissected  using sharp and blunt dissection.  An area on the lesser curve was  identified 4 cm distal to the GE junction, and the lesser sac was entered  using the harmonic scalpel.  Using sequential loads of a blue load 60 mm GIA  stapling device, the pouch was created.  A side-to-side gastrojejunostomy  was performed, bring the Roux limb up in a left-facing fashion with a  posterior serosal layer being accomplished with a running 2-0 Vicryl suture,  and then a GIA 45 stapling device.  Of note, I did not feel the anastomosis  was large enough; therefore, additional firings were required with the blue  load GIA stapling device.  When I was satisfied with the anastomosis, I  closed the defect with running 2-0 Vicryl sutures and then an anterior layer  was performed with a running 2-0 Vicryl suture on an SH needle over the  Ewald tube through the anastomosis.  Upper endoscopy was performed by Dr.  Johna Sheriff, which showed no leak and a patent anastomosis.  Peterson's defect  was then closed with  a running 2-0 silk suture.  Adequate hemostasis was  insured.  Pneumoperitoneum was released.  Trocars were removed.  The skin  was closed with staples.       KRH/MEDQ  D:  05/12/2005  T:  05/12/2005  Job:  [id number]

## 2011-02-20 NOTE — Consult Note (Signed)
[facility]  Patient:    KINTE, TRIM Visit Number: [id number] MRN: [id number]          Service Type: EMS Location: ED Attending Physician:  Hanley Seamen Dictated by:   Veneda Melter, M.D. Proc. Date: 02/25/02 Admit Date:  02/25/2002 Discharge Date: 02/25/2002   CC:         Justine Null, M.D. [facility]  Lamount Cranker, M.D., GI, Marcy Panning  Dr. Rulon Abide, [facility],  [facility]   Consultation Report  CHIEF COMPLAINT:  Chest pain.  HISTORY:  Mr. Cinquemani is a 45 year old white male with insulin dependent diabetes mellitus, hypertension, dyslipidemia, gastroesophageal reflux disease, and intermittent bronchospasms who presents with chest discomfort. The patient has a long history of chest pain and has undergone cardiac catheterization approximately three years ago at [facility] and last year at [facility] both showing no significant coronary disease with possible etiology of pain at the time being vasospasm. The patient reports not feeling well since yesterday afternoon. He felt somewhat nauseous had some vomiting and diarrhea after eating a sandwich at [employer]. Today he had left upper quadrant discomfort with radiation of pain along the left sternal border up into the left neck. This was sharp exacerbated by palpation not associated with shortness of breath, change in position or exertion. No nausea or vomiting today. The patient does do a fair amount of lifting of boxes and computer equipment. 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 He denies any trauma or other heavy exertion. The patient denies any bright red blood per rectum, melena, hematuria or dysuria. He has not had any syncope or near syncope. He denies any palpitations, diaphoresis, sweats or other constitutional symptoms.  PAST MEDICAL HISTORY:  Notable for insulin dependent diabetes mellitus, hypertension, dyslipidemia, gastroesophageal reflux disease, and  bronchospasm.  ALLERGIES:  PENICILLIN, SULFA, IVP DYE.  CURRENT MEDICATIONS:  1. Xanax 0.5 mg b.i.d.  2. Nexium 40 mg b.i.d.  3. Albuterol and singular inhalers.  4. Toprol XL 50 mg q.d.  5. Cozaar 25 mg q.d.  6. Aspirin.  7. Lipitor 10 mg q.d.  8. Humalog insulin 70/30 30 units q.a.m., 30 units q.p.m.  9. Actose 4 mg q.d.  PHYSICAL EXAMINATION:  GENERAL:  He is a well-developed, well-nourished, white male in no acute distress.  VITAL SIGNS:  Blood pressure 142/86, pulse 84, respirations 16. He was afebrile.  HEENT:  Pupils equal round and reactive to light. Extraocular muscles are intact. Oropharynx there is no lesion.  NECK:  Supple, no carotid bruits.  HEART:  Reveals a regular rate without murmurs.  LUNGS:  Clear to auscultation.  ABDOMEN:  Soft, nontender.  EXTREMITIES:  No peripheral edema. Peripheral pulses 2+ and equal. Motor is 5/5, sensory is intact to touch.  SKIN:  Shows no rashes.  ECG is normal sinus rhythm at 93 beats per minute. There is nondescript THE wave flattening inferiorly but no acute ischemic changes. Cardiac enzymes are within normal limits.  ASSESSMENT/PLAN:  Mr. Belger is a 45 year old gentleman with a history of normal coronary arteries by ______ who presents with atypical left sided chest discomfort, does not have associated symptoms to suggest coronary ischemic, but is exacerbated by palpation. He has also had a ______ of symptoms including nausea, abdominal discomfort and diarrhea following meal yesterday. At the present, there is no evidence of coronary ischemia by ECG or laboratory data. The patient will be treated conservatively and will receive an antibiotic from the emergency room per Dr. Beverely Pace in  the emergency room for superficial infection. He will follow-up with his doctors in [place] for routine care. Should he have any recurrent discomfort, he has been advised to return to the emergency room. Dictated by:   Veneda Melter, M.D. Attending Physician:  Hanley Seamen DD:  02/25/02 TD:  02/28/02 Job: [id number]

## 2011-02-20 NOTE — Op Note (Signed)
NAMEBRANSEN, FASSNACHT               ACCOUNT NO.:  [id number]   MEDICAL RECORD NO.:  [id number]          PATIENT TYPE:  INP   LOCATION:  1507                         [facility]ACILITY:  [facility]   PHYSICIAN:  Sharlet Salina T. Hoxworth, M.D.DATE OF BIRTH:  1966-04-15   DATE OF PROCEDURE:  05/12/2005  DATE OF DISCHARGE:                                 OPERATIVE REPORT   PROCEDURE:  Intraoperative upper endoscopy.   SURGEON:  Lorne Skeens. Hoxworth, M.D.   DESCRIPTION OF PROCEDURE:  Upper GI endoscopy is performed at the completion  of laparoscopic Roux-en-Y gastric bypass by Dr. Danna Hefty. The  Olympus video endoscope was inserted into the upper esophagus and then  passed under direct vision to the EG junction at approximately 44 cm. The  small gastric pouch was entered and was distended tightly with air with the  outlet clamped by Dr. Luan Pulling and there was no evidence of leakage  intraoperatively. The anastomosis was inspected and was patent. Staple and  suture lines were intact and without bleeding. The pouch measured 4 cm in  length. At the completion, all air was suctioned and the scope was  withdrawn.      Lorne Skeens. Hoxworth, M.D.  Electronically Signed     BTH/MEDQ  D:  05/12/2005  T:  05/13/2005  Job:  [id number]

## 2011-04-30 ENCOUNTER — Emergency Department (HOSPITAL_COMMUNITY)
Admission: EM | Admit: 2011-04-30 | Discharge: 2011-04-30 | Disposition: A | Discharge status: Home / Self Care | Payer: Self-pay | Attending: Emergency Medicine | Admitting: Emergency Medicine

## 2011-04-30 ENCOUNTER — Emergency Department (HOSPITAL_COMMUNITY): Payer: Self-pay

## 2011-04-30 ENCOUNTER — Encounter (HOSPITAL_COMMUNITY): Payer: Self-pay

## 2011-04-30 DIAGNOSIS — F341 Dysthymic disorder: Secondary | ICD-10-CM | POA: Insufficient documentation

## 2011-04-30 DIAGNOSIS — R109 Unspecified abdominal pain: Secondary | ICD-10-CM | POA: Insufficient documentation

## 2011-04-30 DIAGNOSIS — E785 Hyperlipidemia, unspecified: Secondary | ICD-10-CM | POA: Insufficient documentation

## 2011-04-30 DIAGNOSIS — J45909 Unspecified asthma, uncomplicated: Secondary | ICD-10-CM | POA: Insufficient documentation

## 2011-04-30 DIAGNOSIS — X58XXXA Exposure to other specified factors, initial encounter: Secondary | ICD-10-CM | POA: Insufficient documentation

## 2011-04-30 DIAGNOSIS — Z9884 Bariatric surgery status: Secondary | ICD-10-CM | POA: Insufficient documentation

## 2011-04-30 DIAGNOSIS — S81009A Unspecified open wound, unspecified knee, initial encounter: Secondary | ICD-10-CM | POA: Insufficient documentation

## 2011-04-30 DIAGNOSIS — I10 Essential (primary) hypertension: Secondary | ICD-10-CM | POA: Insufficient documentation

## 2011-04-30 DIAGNOSIS — L02419 Cutaneous abscess of limb, unspecified: Secondary | ICD-10-CM | POA: Insufficient documentation

## 2011-04-30 DIAGNOSIS — R1031 Right lower quadrant pain: Secondary | ICD-10-CM | POA: Insufficient documentation

## 2011-04-30 DIAGNOSIS — R110 Nausea: Secondary | ICD-10-CM | POA: Insufficient documentation

## 2011-04-30 LAB — DIFFERENTIAL
Basophils Absolute: 0 10*3/uL (ref 0.0–0.1)
Basophils Relative: 1 % (ref 0–1)
Eosinophils Absolute: 0.2 10*3/uL (ref 0.0–0.7)
Monocytes Relative: 8 % (ref 3–12)
Neutro Abs: 3.4 10*3/uL (ref 1.7–7.7)
Neutrophils Relative %: 52 % (ref 43–77)

## 2011-04-30 LAB — COMPREHENSIVE METABOLIC PANEL
ALT: 10 U/L (ref 0–53)
AST: 15 U/L (ref 0–37)
Alkaline Phosphatase: 75 U/L (ref 39–117)
CO2: 30 mEq/L (ref 19–32)
Calcium: 9.3 mg/dL (ref 8.4–10.5)
GFR calc non Af Amer: >60 mL/min (ref >60)
Potassium: 3.9 mEq/L (ref 3.5–5.1)
Sodium: 137 mEq/L (ref 135–145)

## 2011-04-30 LAB — URINALYSIS, ROUTINE W REFLEX MICROSCOPIC
Bilirubin Urine: NEGATIVE
Glucose, UA: NEGATIVE mg/dL
Hgb urine dipstick: NEGATIVE
Protein, ur: NEGATIVE mg/dL
Specific Gravity, Urine: 1.008 (ref 1.005–1.030)

## 2011-04-30 LAB — GLUCOSE, CAPILLARY: Glucose-Capillary: 127 mg/dL — ABNORMAL HIGH (ref 70–99)

## 2011-04-30 LAB — CBC
MCH: 30.1 pg (ref 26.0–34.0)
Platelets: 224 10*3/uL (ref 150–400)
RBC: 4.38 MIL/uL (ref 4.22–5.81)
WBC: 6.5 10*3/uL (ref 4.0–10.5)

## 2011-06-07 ENCOUNTER — Emergency Department (HOSPITAL_COMMUNITY): Payer: Self-pay

## 2011-06-07 ENCOUNTER — Emergency Department (HOSPITAL_COMMUNITY)
Admission: EM | Admit: 2011-06-07 | Discharge: 2011-06-07 | Disposition: A | Discharge status: Home / Self Care | Payer: Self-pay | Attending: Emergency Medicine | Admitting: Emergency Medicine

## 2011-06-07 DIAGNOSIS — IMO0002 Reserved for concepts with insufficient information to code with codable children: Secondary | ICD-10-CM | POA: Insufficient documentation

## 2011-06-07 DIAGNOSIS — I10 Essential (primary) hypertension: Secondary | ICD-10-CM | POA: Insufficient documentation

## 2011-06-07 DIAGNOSIS — E785 Hyperlipidemia, unspecified: Secondary | ICD-10-CM | POA: Insufficient documentation

## 2011-06-07 DIAGNOSIS — M719 Bursopathy, unspecified: Secondary | ICD-10-CM | POA: Insufficient documentation

## 2011-06-07 DIAGNOSIS — Y990 Civilian activity done for income or pay: Secondary | ICD-10-CM | POA: Insufficient documentation

## 2011-06-07 DIAGNOSIS — Y9269 Other specified industrial and construction area as the place of occurrence of the external cause: Secondary | ICD-10-CM | POA: Insufficient documentation

## 2011-06-07 DIAGNOSIS — M67919 Unspecified disorder of synovium and tendon, unspecified shoulder: Secondary | ICD-10-CM | POA: Insufficient documentation

## 2011-07-17 LAB — CBC
HCT: 38.6 — ABNORMAL LOW
MCV: 94
RBC: 4.11 — ABNORMAL LOW
WBC: 4.5

## 2011-07-17 LAB — COMPREHENSIVE METABOLIC PANEL
BUN: 10
CO2: 29
Chloride: 105
Creatinine, Ser: 0.92
GFR calc non Af Amer: >60
Total Bilirubin: 0.6

## 2012-05-08 ENCOUNTER — Encounter (HOSPITAL_COMMUNITY): Payer: Self-pay

## 2012-05-08 ENCOUNTER — Emergency Department (INDEPENDENT_AMBULATORY_CARE_PROVIDER_SITE_OTHER): Payer: Self-pay

## 2012-05-08 ENCOUNTER — Emergency Department (HOSPITAL_COMMUNITY)
Admission: EM | Admit: 2012-05-08 | Discharge: 2012-05-08 | Disposition: A | Discharge status: Home / Self Care | Payer: Self-pay | Source: Home / Self Care | Attending: Emergency Medicine | Admitting: Emergency Medicine

## 2012-05-08 DIAGNOSIS — S63509A Unspecified sprain of unspecified wrist, initial encounter: Secondary | ICD-10-CM

## 2012-05-08 HISTORY — DX: Essential (primary) hypertension: I10

## 2012-05-08 NOTE — ED Notes (Signed)
Gave Pt a small ice bag bag per NP Kabbe

## 2012-05-08 NOTE — ED Notes (Signed)
Pt was using a chop saw last pm and it caught his rt index finger and jerked his wrist, having rt finger and wrist pain.

## 2012-05-08 NOTE — ED Provider Notes (Signed)
History     CSN: [id number]  Arrival date & time 05/08/12  1603   First MD Initiated Contact with Patient 05/08/12 1853      Chief Complaint  Patient presents with  . Wrist Pain    (Consider location/radiation/quality/duration/timing/severity/associated sxs/prior treatment) HPI Comments: Pt was using a "chop saw" last night, a piece of metal got caught in it, jerking R index finger and R wrist, causing pain/injury.  C/o throbbing pain  Patient is a 46 y.o. male presenting with wrist pain. The history is provided by the patient.  Wrist Pain This is a new problem. The current episode started yesterday. The problem occurs constantly. The problem has not changed since onset.Exacerbated by: palpation. Nothing relieves the symptoms. He has tried a cold compress (ibuprofen, ultram) for the symptoms. The treatment provided no relief.    Past Medical History  Diagnosis Date  . Diabetes mellitus   . Hypertension     Past Surgical History  Procedure Date  . Gastric bypass   . Carpal tunnel release   . Rotator cuff repair     History reviewed. No pertinent family history.  History  Substance Use Topics  . Smoking status: Current Everyday Smoker -- 1.0 packs/day  . Smokeless tobacco: Not on file  . Alcohol Use: No      Review of Systems  Musculoskeletal: Positive for joint swelling.       R wrist and index finger pain  Skin: Negative for color change.       Abrasion R index finger  Neurological: Negative for weakness and numbness.    Allergies  Omnipaque; Penicillins; and Sulfa antibiotics  Home Medications   Current Outpatient Rx  Name Route Sig Dispense Refill  . PRESCRIPTION MEDICATION  Unknown cholesterol, b/p and and diabetic medication      BP 135/87  Pulse 88  Temp 98.7 F (37.1 C) (Oral)  Resp 19  SpO2 97%  Physical Exam  Constitutional: He appears well-developed and well-nourished. No distress.  Cardiovascular:  Pulses:      Radial pulses are 2+  on the right side.  Pulmonary/Chest: Effort normal.  Musculoskeletal:       Right wrist: He exhibits tenderness, bony tenderness and swelling. He exhibits no deformity.       Right hand: He exhibits tenderness and swelling. He exhibits normal range of motion, normal capillary refill and no laceration. normal sensation noted.       Swelling, tender to palp R index finger, no deformity, normal ROM.  Swelling and tender to palp R radial wrist, minimal tender to palp ulnar wrist.   Skin: Skin is warm and dry. Abrasion noted. No bruising noted. No erythema.       Small 0.5cm superficial abrasion distal, dorsal R index finger    ED Course  Procedures (including critical care time)  Labs Reviewed - No data to display Dg Hand Complete Right  05/08/2012  *RADIOLOGY REPORT*  Clinical Data: Injury, pain  RIGHT HAND - COMPLETE 3+ VIEW  Comparison: 03/15/2006  Findings: Flattening of the lunate articular surface with subchondral lucency.  Soft tissue swelling noted.  Differential considerations include acute fracture versus avascular necrosis (Keinbock'sdisease).  Distal radius and ulna intact.  Other carpal bones intact.  Mild soft tissue swelling most evident on the lateral view extending into the hand.  IMPRESSION: Abnormal lunate with flattening, subchondral lucency and irregularity could represent acute injury versus AVN as described.  Original Report Authenticated By: Judie Petit. TREVOR SHICK, M.D.  1. Wrist sprain       MDM  Pt without hx wrist pain, x-ray findings don't correlate clinically but are of concern.  Wrist splinted with velcro splint and pt referred to hand surgeon.          Cathlyn Parsons, NP 05/08/12 2125

## 2012-05-12 NOTE — ED Provider Notes (Signed)
Medical screening examination/treatment/procedure(s) were performed by non-physician practitioner and as supervising physician I was immediately available for consultation/collaboration.  Luiz Blare MD   Luiz Blare, MD 05/12/12 [phone]

## 2012-06-29 ENCOUNTER — Other Ambulatory Visit (HOSPITAL_COMMUNITY): Payer: Self-pay | Admitting: Orthopedic Surgery

## 2012-06-29 DIAGNOSIS — M25531 Pain in right wrist: Secondary | ICD-10-CM

## 2012-07-09 ENCOUNTER — Ambulatory Visit (HOSPITAL_COMMUNITY)
Admission: RE | Admit: 2012-07-09 | Discharge: 2012-07-09 | Disposition: A | Discharge status: Home / Self Care | Payer: Self-pay | Source: Ambulatory Visit | Attending: Orthopedic Surgery | Admitting: Orthopedic Surgery

## 2012-07-09 DIAGNOSIS — M25531 Pain in right wrist: Secondary | ICD-10-CM

## 2012-07-09 DIAGNOSIS — M9380 Other specified osteochondropathies of unspecified site: Secondary | ICD-10-CM | POA: Insufficient documentation

## 2012-07-09 DIAGNOSIS — M24139 Other articular cartilage disorders, unspecified wrist: Secondary | ICD-10-CM | POA: Insufficient documentation

## 2012-07-29 ENCOUNTER — Other Ambulatory Visit: Payer: Self-pay | Admitting: Orthopedic Surgery

## 2012-08-04 ENCOUNTER — Encounter (HOSPITAL_BASED_OUTPATIENT_CLINIC_OR_DEPARTMENT_OTHER): Payer: Self-pay | Admitting: *Deleted

## 2012-08-04 ENCOUNTER — Encounter (HOSPITAL_BASED_OUTPATIENT_CLINIC_OR_DEPARTMENT_OTHER)
Admission: RE | Admit: 2012-08-04 | Discharge: 2012-08-04 | Disposition: A | Discharge status: Home / Self Care | Payer: Self-pay | Source: Ambulatory Visit | Attending: Orthopedic Surgery | Admitting: Orthopedic Surgery

## 2012-08-04 ENCOUNTER — Other Ambulatory Visit: Payer: Self-pay

## 2012-08-04 LAB — BASIC METABOLIC PANEL
BUN: 9 mg/dL (ref 6–23)
Calcium: 9.6 mg/dL (ref 8.4–10.5)
Creatinine, Ser: 0.78 mg/dL (ref 0.50–1.35)
GFR calc non Af Amer: >90 mL/min (ref >90)
Glucose, Bld: 184 mg/dL — ABNORMAL HIGH (ref 70–99)
Sodium: 135 mEq/L (ref 135–145)

## 2012-08-04 NOTE — Progress Notes (Signed)
Pt had gastric bp 2006-went from 320-188 He had had abn ekg0cardiac workups 2001 at [facility]-and 05-[facility]-no cad- Has had multiple surgeries since then--CTR and Shoulders here [facility]-last 2010 Diabetes more controlled-hx copd-controlled To come in for ekg and bmet

## 2012-08-04 NOTE — Progress Notes (Signed)
Reviewed with dr Roland Rack

## 2012-08-09 ENCOUNTER — Encounter (HOSPITAL_BASED_OUTPATIENT_CLINIC_OR_DEPARTMENT_OTHER): Payer: Self-pay

## 2012-08-09 ENCOUNTER — Ambulatory Visit (HOSPITAL_BASED_OUTPATIENT_CLINIC_OR_DEPARTMENT_OTHER): Payer: Self-pay | Admitting: Anesthesiology

## 2012-08-09 ENCOUNTER — Encounter (HOSPITAL_BASED_OUTPATIENT_CLINIC_OR_DEPARTMENT_OTHER): Payer: Self-pay | Admitting: Anesthesiology

## 2012-08-09 ENCOUNTER — Ambulatory Visit (HOSPITAL_BASED_OUTPATIENT_CLINIC_OR_DEPARTMENT_OTHER)
Admission: RE | Admit: 2012-08-09 | Discharge: 2012-08-09 | Disposition: A | Discharge status: Home / Self Care | Payer: Self-pay | Source: Ambulatory Visit | Attending: Orthopedic Surgery | Admitting: Orthopedic Surgery

## 2012-08-09 ENCOUNTER — Encounter (HOSPITAL_BASED_OUTPATIENT_CLINIC_OR_DEPARTMENT_OTHER)
Admission: RE | Disposition: A | Discharge status: Home / Self Care | Payer: Self-pay | Source: Ambulatory Visit | Attending: Orthopedic Surgery

## 2012-08-09 DIAGNOSIS — M9230 Other juvenile osteochondrosis, unspecified upper limb: Secondary | ICD-10-CM | POA: Insufficient documentation

## 2012-08-09 DIAGNOSIS — E119 Type 2 diabetes mellitus without complications: Secondary | ICD-10-CM | POA: Insufficient documentation

## 2012-08-09 DIAGNOSIS — I10 Essential (primary) hypertension: Secondary | ICD-10-CM | POA: Insufficient documentation

## 2012-08-09 DIAGNOSIS — F17200 Nicotine dependence, unspecified, uncomplicated: Secondary | ICD-10-CM | POA: Insufficient documentation

## 2012-08-09 HISTORY — DX: Unspecified osteoarthritis, unspecified site: M19.90

## 2012-08-09 HISTORY — DX: Bariatric surgery status: Z98.84

## 2012-08-09 HISTORY — DX: Reserved for concepts with insufficient information to code with codable children: IMO0002

## 2012-08-09 HISTORY — DX: Anxiety disorder, unspecified: F41.9

## 2012-08-09 HISTORY — DX: Major depressive disorder, single episode, unspecified: F32.9

## 2012-08-09 HISTORY — PX: WRIST FUSION: SHX839

## 2012-08-09 HISTORY — DX: Presence of dental prosthetic device (complete) (partial): Z97.2

## 2012-08-09 HISTORY — DX: Depression, unspecified: F32.A

## 2012-08-09 HISTORY — DX: Nicotine dependence, unspecified, uncomplicated: F17.200

## 2012-08-09 HISTORY — DX: Unspecified asthma, uncomplicated: J45.909

## 2012-08-09 HISTORY — DX: Gastro-esophageal reflux disease without esophagitis: K21.9

## 2012-08-09 HISTORY — DX: Complete loss of teeth, unspecified cause, unspecified class: K08.109

## 2012-08-09 SURGERY — FUSION, JOINT, WRIST
Anesthesia: Regional | Site: Wrist | Laterality: Right | Wound class: Clean

## 2012-08-09 MED ORDER — LIDOCAINE HCL (CARDIAC) 20 MG/ML IV SOLN
INTRAVENOUS | Status: DC | PRN
  Administered 2012-08-09: 40 mg via INTRAVENOUS

## 2012-08-09 MED ORDER — CHLORHEXIDINE GLUCONATE 4 % EX LIQD: 60.0000 mL | Freq: Once | CUTANEOUS | Status: DC

## 2012-08-09 MED ORDER — PROPOFOL 10 MG/ML IV BOLUS
INTRAVENOUS | Status: DC | PRN
  Administered 2012-08-09: 200 mg via INTRAVENOUS

## 2012-08-09 MED ORDER — OXYCODONE-ACETAMINOPHEN 5-325 MG PO TABS: ORAL_TABLET | ORAL | Status: DC

## 2012-08-09 MED ORDER — ONDANSETRON HCL 4 MG/2ML IJ SOLN
INTRAMUSCULAR | Status: DC | PRN
  Administered 2012-08-09: 4 mg via INTRAVENOUS

## 2012-08-09 MED ORDER — OXYCODONE HCL 5 MG/5ML PO SOLN: 5.0000 mg | Freq: Once | ORAL | Status: DC | PRN

## 2012-08-09 MED ORDER — FENTANYL CITRATE 0.05 MG/ML IJ SOLN
50.0000 ug | INTRAMUSCULAR | Status: DC | PRN
  Administered 2012-08-09: 100 ug via INTRAVENOUS

## 2012-08-09 MED ORDER — MIDAZOLAM HCL 2 MG/2ML IJ SOLN
0.5000 mg | INTRAMUSCULAR | Status: DC | PRN
  Administered 2012-08-09: 2 mg via INTRAVENOUS

## 2012-08-09 MED ORDER — VANCOMYCIN HCL IN DEXTROSE 1-5 GM/200ML-% IV SOLN
1000.0000 mg | INTRAVENOUS | Status: AC
  Administered 2012-08-09: 1000 mg via INTRAVENOUS

## 2012-08-09 MED ORDER — LACTATED RINGERS IV SOLN
INTRAVENOUS | Status: DC
  Administered 2012-08-09 (×2): via INTRAVENOUS

## 2012-08-09 MED ORDER — OXYCODONE HCL 5 MG PO TABS: 5.0000 mg | ORAL_TABLET | Freq: Once | ORAL | Status: DC | PRN

## 2012-08-09 MED ORDER — BUPIVACAINE-EPINEPHRINE PF 0.5-1:200000 % IJ SOLN
INTRAMUSCULAR | Status: DC | PRN
  Administered 2012-08-09: 30 mL

## 2012-08-09 MED ORDER — BUPIVACAINE HCL (PF) 0.5 % IJ SOLN
INTRAMUSCULAR | Status: DC | PRN
  Administered 2012-08-09: 10 mL

## 2012-08-09 MED ORDER — HYDROMORPHONE HCL PF 1 MG/ML IJ SOLN: 0.2500 mg | INTRAMUSCULAR | Status: DC | PRN

## 2012-08-09 SURGICAL SUPPLY — 67 items
BANDAGE CONFORM 3  STR LF (GAUZE/BANDAGES/DRESSINGS) IMPLANT
BANDAGE ELASTIC 3 VELCRO ST LF (GAUZE/BANDAGES/DRESSINGS) ×2 IMPLANT
BANDAGE GAUZE ELAST BULKY 4 IN (GAUZE/BANDAGES/DRESSINGS) ×2 IMPLANT
BIT DRILL ACUTRAK MICRO 2 (BIT) ×1 IMPLANT
BLADE MINI RND TIP GREEN BEAV (BLADE) ×2 IMPLANT
BLADE SURG 15 STRL LF DISP TIS (BLADE) ×2 IMPLANT
BLADE SURG 15 STRL SS (BLADE) ×4
BNDG CMPR 9X4 STRL LF SNTH (GAUZE/BANDAGES/DRESSINGS) ×1
BNDG CMPR MD 5X2 ELC HKLP STRL (GAUZE/BANDAGES/DRESSINGS) ×1
BNDG ELASTIC 2 VLCR STRL LF (GAUZE/BANDAGES/DRESSINGS) ×2 IMPLANT
BNDG ESMARK 4X9 LF (GAUZE/BANDAGES/DRESSINGS) ×2 IMPLANT
BUR EGG 3PK/BX (BURR) ×2 IMPLANT
BUR FAST CUTTING MED (BURR) IMPLANT
BUR ROUND CARBIDE (BURR) IMPLANT
CHLORAPREP W/TINT 26ML (MISCELLANEOUS) ×2 IMPLANT
CLOTH BEACON ORANGE TIMEOUT ST (SAFETY) ×2 IMPLANT
CORDS BIPOLAR (ELECTRODE) ×2 IMPLANT
COVER MAYO STAND STRL (DRAPES) ×2 IMPLANT
COVER TABLE BACK 60X90 (DRAPES) ×2 IMPLANT
DRAPE EXTREMITY T 121X128X90 (DRAPE) ×2 IMPLANT
DRAPE OEC MINIVIEW 54X84 (DRAPES) ×2 IMPLANT
DRAPE SURG 17X23 STRL (DRAPES) ×2 IMPLANT
DRILL ACUTRAK MICRO 2 (BIT) ×2
GAUZE XEROFORM 1X8 LF (GAUZE/BANDAGES/DRESSINGS) ×2 IMPLANT
GLOVE BIO SURGEON STRL SZ 6.5 (GLOVE) ×2 IMPLANT
GLOVE BIO SURGEON STRL SZ7.5 (GLOVE) ×2 IMPLANT
GLOVE BIOGEL PI IND STRL 7.0 (GLOVE) ×1 IMPLANT
GLOVE BIOGEL PI IND STRL 8 (GLOVE) ×1 IMPLANT
GLOVE BIOGEL PI INDICATOR 7.0 (GLOVE) ×1
GLOVE BIOGEL PI INDICATOR 8 (GLOVE) ×1
GLOVE INDICATOR 8.5 STRL (GLOVE) ×2 IMPLANT
GLOVE SURG ORTHO 8.0 STRL STRW (GLOVE) ×2 IMPLANT
GOWN PREVENTION PLUS XLARGE (GOWN DISPOSABLE) ×2 IMPLANT
GOWN STRL REIN XL XLG (GOWN DISPOSABLE) ×2 IMPLANT
GUIDEWIRE ORTHO MICROSHT  ACUT (WIRE) ×1
GUIDEWIRE ORTHO MICROSHT .035 (WIRE) ×1 IMPLANT
NEEDLE HYPO 22GX1.5 SAFETY (NEEDLE) IMPLANT
NEEDLE HYPO 25X1 1.5 SAFETY (NEEDLE) IMPLANT
NS IRRIG 1000ML POUR BTL (IV SOLUTION) ×2 IMPLANT
PACK BASIN DAY SURGERY FS (CUSTOM PROCEDURE TRAY) ×2 IMPLANT
PAD CAST 3X4 CTTN HI CHSV (CAST SUPPLIES) ×1 IMPLANT
PAD CAST 4YDX4 CTTN HI CHSV (CAST SUPPLIES) IMPLANT
PADDING CAST ABS 4INX4YD NS (CAST SUPPLIES) ×1
PADDING CAST ABS COTTON 4X4 ST (CAST SUPPLIES) ×1 IMPLANT
PADDING CAST COTTON 3X4 STRL (CAST SUPPLIES) ×2
PADDING CAST COTTON 4X4 STRL (CAST SUPPLIES)
SCREW ACUTRAK 2 MICRO 18MM (Screw) ×2 IMPLANT
SCREW ACUTRAK 2 MICRO 20MM (Screw) ×2 IMPLANT
SLEEVE SCD COMPRESS KNEE MED (MISCELLANEOUS) ×2 IMPLANT
SPLINT PLASTER CAST XFAST 3X15 (CAST SUPPLIES) ×10 IMPLANT
SPLINT PLASTER CAST XFAST 4X15 (CAST SUPPLIES) IMPLANT
SPLINT PLASTER XTRA FAST SET 4 (CAST SUPPLIES)
SPLINT PLASTER XTRA FASTSET 3X (CAST SUPPLIES) ×10
SPONGE GAUZE 4X4 12PLY (GAUZE/BANDAGES/DRESSINGS) ×2 IMPLANT
STOCKINETTE 4X48 STRL (DRAPES) ×2 IMPLANT
SUCTION FRAZIER TIP 10 FR DISP (SUCTIONS) IMPLANT
SUT ETHILON 3 0 PS 1 (SUTURE) IMPLANT
SUT ETHILON 4 0 PS 2 18 (SUTURE) IMPLANT
SUT VIC AB 3-0 PS1 18 (SUTURE)
SUT VIC AB 3-0 PS1 18XBRD (SUTURE) IMPLANT
SUT VICRYL 4-0 PS2 18IN ABS (SUTURE) ×2 IMPLANT
SYR BULB 3OZ (MISCELLANEOUS) ×2 IMPLANT
SYR CONTROL 10ML LL (SYRINGE) IMPLANT
TOWEL OR 17X24 6PK STRL BLUE (TOWEL DISPOSABLE) ×2 IMPLANT
TUBE CONNECTING 20X1/4 (TUBING) IMPLANT
UNDERPAD 30X30 INCONTINENT (UNDERPADS AND DIAPERS) ×2 IMPLANT
WATER STERILE IRR 1000ML POUR (IV SOLUTION) IMPLANT

## 2012-08-09 NOTE — Progress Notes (Signed)
Assisted Dr. Fitzgerald with right, ultrasound guided, supraclavicular block. Side rails up, monitors on throughout procedure. See vital signs in flow sheet. Tolerated Procedure well. 

## 2012-08-09 NOTE — Transfer of Care (Signed)
Immediate Anesthesia Transfer of Care Note  Patient: Timothy [name]  Procedure(s) Performed: Procedure(s) (LRB) with comments: ARTHRODESIS WRIST (Right) - Right Scaphoid-trapezium-trapezoid fusion, accutrak with radial bone graft     Patient Location: PACU  Anesthesia Type:General and Regional  Level of Consciousness: awake and patient cooperative  Airway & Oxygen Therapy: Patient Spontanous Breathing and Patient connected to face mask oxygen  Post-op Assessment: Report given to PACU RN and Post -op Vital signs reviewed and stable  Post vital signs: Reviewed and stable  Complications: No apparent anesthesia complications

## 2012-08-09 NOTE — Anesthesia Preprocedure Evaluation (Addendum)
Anesthesia Evaluation  Patient identified by MRN, date of birth, ID band Patient awake    Reviewed: Allergy & Precautions, H&P , NPO status , Patient's Chart, lab work & pertinent test results  Airway Mallampati: II TM Distance: >3 FB Neck ROM: Full    Dental No notable dental hx. (+) Edentulous Upper, Edentulous Lower and Dental Advisory Given   Pulmonary neg pulmonary ROS, asthma , Current Smoker,  breath sounds clear to auscultation  Pulmonary exam normal       Cardiovascular hypertension, On Medications and Pt. on home beta blockers Rhythm:Regular Rate:Normal     Neuro/Psych PSYCHIATRIC DISORDERS  Neuromuscular disease    GI/Hepatic Neg liver ROS, GERD-  Medicated and Controlled,  Endo/Other  diabetes, Type 2, Oral Hypoglycemic Agents  Renal/GU negative Renal ROS  negative genitourinary   Musculoskeletal   Abdominal   Peds  Hematology negative hematology ROS (+)   Anesthesia Other Findings   Reproductive/Obstetrics negative OB ROS                          Anesthesia Physical Anesthesia Plan  ASA: III  Anesthesia Plan: General and Regional   Post-op Pain Management:    Induction: Intravenous  Airway Management Planned: LMA  Additional Equipment:   Intra-op Plan:   Post-operative Plan: Extubation in OR  Informed Consent: I have reviewed the patients History and Physical, chart, labs and discussed the procedure including the risks, benefits and alternatives for the proposed anesthesia with the patient or authorized representative who has indicated his/her understanding and acceptance.   Dental advisory given  Plan Discussed with: CRNA and Surgeon  Anesthesia Plan Comments:         Anesthesia Quick Evaluation

## 2012-08-09 NOTE — Anesthesia Procedure Notes (Addendum)
Anesthesia Regional Block:  Supraclavicular block  Pre-Anesthetic Checklist: ,, timeout performed, Correct Patient, Correct Site, Correct Laterality, Correct Procedure, Correct Position, site marked, Risks and benefits discussed, pre-op evaluation, post-op pain management  Laterality: Right  Prep: Maximum Sterile Barrier Precautions used and chloraprep       Needles:  Injection technique: Single-shot  Needle Type: Echogenic Stimulator Needle      Needle Gauge: 22 and 22 G    Additional Needles:  Procedures: ultrasound guided (picture in chart) and nerve stimulator Supraclavicular block Narrative:  Start time: 08/09/2012 9:08 AM End time: 08/09/2012 9:16 AM Injection made incrementally with aspirations every 5 mL. Anesthesiologist: Fitzgerald,MD  Additional Notes: 2% Lidocaine skin wheel. Intercostobrachial block with 8cc of 0.5% Bupivicaine plain.  Supraclavicular block Procedure Name: LMA Insertion Date/Time: 08/09/2012 9:50 AM Performed by: Burna Cash Pre-anesthesia Checklist: Patient identified, Emergency Drugs available, Suction available and Patient being monitored Patient Re-evaluated:Patient Re-evaluated prior to inductionOxygen Delivery Method: Circle System Utilized Preoxygenation: Pre-oxygenation with 100% oxygen Intubation Type: IV induction Ventilation: Mask ventilation without difficulty LMA: LMA inserted LMA Size: 5.0 Number of attempts: 1 Airway Equipment and Method: bite block Placement Confirmation: positive ETCO2 Tube secured with: Tape Dental Injury: Teeth and Oropharynx as per pre-operative assessment

## 2012-08-09 NOTE — Anesthesia Postprocedure Evaluation (Signed)
  Anesthesia Post-op Note  Patient: Timothy [name]  Procedure(s) Performed: Procedure(s) (LRB) with comments: ARTHRODESIS WRIST (Right) - Right Scaphoid-trapezium-trapezoid fusion, accutrak with radial bone graft     Patient Location: PACU  Anesthesia Type:GA combined with regional for post-op pain  Level of Consciousness: awake, alert  and oriented  Airway and Oxygen Therapy: Patient Spontanous Breathing  Post-op Pain: none  Post-op Assessment: Post-op Vital signs reviewed, Patient's Cardiovascular Status Stable, Respiratory Function Stable, Patent Airway and No signs of Nausea or vomiting  Post-op Vital Signs: Reviewed and stable  Complications: No apparent anesthesia complications

## 2012-08-09 NOTE — Op Note (Signed)
Dictation [phone]

## 2012-08-09 NOTE — H&P (Signed)
Timothy [name] is an 46 y.o. male.   Chief Complaint: Kienbocks right wrist HPI: 47 yo lhd male with right wrist pain that has been worsening.  He has worn a splint and iced the wrist.  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   Past Medical History  Diagnosis Date  . Diabetes mellitus   . Hypertension   . Anxiety   . Depression   . Asthma   . GERD (gastroesophageal reflux disease)   . Arthritis   . DDD (degenerative disc disease)   . Full dentures   . H/O gastric bypass   . Coronary artery spasm     hx-no MI  . Smokes     Past Surgical History  Procedure Date  . Gastric bypass   . Carpal tunnel release   . Rotator cuff repair   . Gastric bypass 2006  . Carpal tunnel release 2006    left  . Carpal tunnel release 2010    right-DSC  . Shoulder arthroscopy 2012    right  . Shoulder arthroscopy 2005    left  . Skin graft full thickness leg 2006    lt lower leg with VAC post dog bite  . Multiple tooth extractions     History reviewed. No pertinent family history. Social History:  reports that he has been smoking.  He does not have any smokeless tobacco history on file. He reports that he does not drink alcohol or use illicit drugs.  Allergies:  Allergies  Allergen Reactions  . Omnipaque (Iohexol) Anaphylaxis  . Penicillins   . Shellfish Allergy   . Sulfa Antibiotics     Medications Prior to Admission  Medication Sig Dispense Refill  . albuterol (PROVENTIL HFA;VENTOLIN HFA) 108 (90 BASE) MCG/ACT inhaler Inhale 2 puffs into the lungs every 6 (six) hours as needed.      Marland Kitchen aspirin 81 MG tablet Take 81 mg by mouth daily.      . budesonide-formoterol (SYMBICORT) 160-4.5 MCG/ACT inhaler Inhale 2 puffs into the lungs every morning.      . clonazePAM (KLONOPIN) 0.5 MG tablet Take 0.5 mg by mouth 2 (two) times daily as needed.      . furosemide (LASIX) 40 MG tablet Take 40 mg by mouth daily.      Marland Kitchen HYDROcodone-acetaminophen  (NORCO/VICODIN) 5-325 MG per tablet Take 1 tablet by mouth every 6 (six) hours as needed.      Marland Kitchen lisinopril (PRINIVIL,ZESTRIL) 10 MG tablet Take 10 mg by mouth daily.      . metFORMIN (GLUCOPHAGE) 1000 MG tablet Take 1,000 mg by mouth daily with breakfast.      . metoprolol succinate (TOPROL-XL) 100 MG 24 hr tablet Take 100 mg by mouth daily. Take with or immediately following a meal.      . montelukast (SINGULAIR) 10 MG tablet Take 10 mg by mouth every morning.      Marland Kitchen morphine (MSIR) 30 MG tablet Take 30 mg by mouth every 6 (six) hours as needed.      . saxagliptin HCl (ONGLYZA) 2.5 MG TABS tablet Take by mouth daily.      . sertraline (ZOLOFT) 50 MG tablet Take 50 mg by mouth daily.      . simvastatin (ZOCOR) 20 MG tablet Take 20 mg by mouth every evening.        No results found for this or any previous visit (from the past 48 hour(s)).  No results found.   A  comprehensive review of systems was negative except for: Respiratory: positive for asthma  Blood pressure 126/73, pulse 67, temperature 98.3 F (36.8 C), temperature source Oral, resp. rate 20, height 5\' 9"  (1.753 m), weight 85.276 kg (188 lb), SpO2 99.00%.  General appearance: alert, cooperative and appears stated age Head: Normocephalic, without obvious abnormality, atraumatic Neck: supple, symmetrical, trachea midline Resp: clear to auscultation bilaterally Cardio: regular rate and rhythm GI: non tender Extremities: light touch sensation and capillary refill intact all digits.  +epl/fpl/io. skin intact.  ttp at wrist Pulses: 2+ and symmetric Skin: Skin color, texture, turgor normal. No rashes or lesions Neurologic: Grossly normal Incision/Wound: na  Assessment/Plan Right wrist Kienbock's with lunate fragmentation.  Discussed non operative and operative treatment options.  He wishes to proceed with operative treatment.  Risks, benefits, and alternatives of surgery were discussed and the patient agrees with the plan of  care.   Chaston Bradburn R 08/09/2012, 8:54 AM

## 2012-08-10 ENCOUNTER — Encounter (HOSPITAL_BASED_OUTPATIENT_CLINIC_OR_DEPARTMENT_OTHER): Payer: Self-pay | Admitting: Orthopedic Surgery

## 2012-08-10 NOTE — Op Note (Signed)
Timothy [name], Timothy [name] NO.:  [id number]  MEDICAL RECORD NO.:  [id number]  LOCATION:                                 FACILITY:  PHYSICIAN:  Betha Loa, MD        DATE OF BIRTH:  January 12, 1966  DATE OF PROCEDURE:  08/09/2012 DATE OF DISCHARGE:                              OPERATIVE REPORT   PREOPERATIVE DIAGNOSIS:  Right wrist Kienbock disease.  POSTOPERATIVE DIAGNOSIS:  Right wrist Kienbock disease.  PROCEDURE:  Right wrist scaphotrapeziotrapezoid fusion with distal radial bone graft.  SURGEON:  Betha Loa, MD  ASSISTANT:  [name], MD  ANESTHESIA:  General with regional.  IV FLUIDS:  Per anesthesia flow sheet.  ESTIMATED BLOOD LOSS:  Minimal.  COMPLICATIONS:  None.  SPECIMENS:  None.  TOURNIQUET TIME:  91 minutes.  DISPOSITION:  Stable to PACU.  INDICATIONS:  Timothy [name] is a 46 year old left-hand dominant male who has been having pain in his right wrist.  Radiographs and MRI confirmed Kienbock disease of the right wrist with fragmentation of the lunate.  I discussed with Timothy [name] the nature of the condition.  We discussed nonoperative and operative treatment options.  Risks, benefits, and alternatives of surgery were discussed including risk of blood loss, infection, damage to nerves, vessels, tendons, ligaments, bone; failure of surgery; need for additional surgery, complications with wound healing; continued pain; nonunion; malunion; stiffness.  He voided understanding of these risks and elected to proceed.  OPERATIVE COURSE:  After being identified preoperatively by myself, the patient and I agreed upon procedure and site of procedure.  Surgical site was marked.  The risks, benefits, and alternatives of surgery were reviewed and he wished to proceed.  Surgical consent had been signed. He was given a gram of vancomycin as preoperative antibiotic prophylaxis due to penicillin allergy.  A regional block was performed by anesthesia in  preoperative holding.  He was transported to the operating room and placed on the operating room table in supine position with the right upper extremity on arm board.  General anesthesia was induced by the anesthesiologist.  Right upper extremity was prepped and draped in normal sterile orthopedic fashion.  Surgical pause was performed between surgeons, anesthesia, and operating staff, and all were in agreement as to the patient, procedure, and site of procedure.  Tourniquet at the proximal aspect of the extremities inflated to 250 mmHg after exsanguination of limb with Esmarch bandage.  An incision was made over the FCR tendon and then out onto the thenar eminence.  This was carried into subcutaneous tissues by spreading technique.  The superficial branch of the radial artery was identified and was protected throughout the case.  The FCR tendon sheath was incised.  The scaphotrapezial joint was identified.  The capsule and soft tissue covering this were elevated.  The joint was localized using the needle prior to opening. The C-arm was used to confirm this.  Rongeurs were used to remove the cartilage from the distal part of the scaphoid and the proximal aspect of the trapezium as well as at the trapezoid.  A burr was then used to remove the subchondral bone.  The joint was irrigated.  Bone graft was then taken from the distal radius after removing the cortical window using 0.045 inch K-wire.  This wound was then irrigated as well.  The guide pins for the micro Acutrak screw set were used.  One was placed through the scaphoid into the trapezoid and one was placed through the scaphoid into the trapezium.  C-arm was used in AP and lateral projections to ensure appropriate position of the guide pins.  They were dressed until this was achieved.  Micro Acutrak screws were then used. A 20 mm was used in the scaphotrapezoid and a 18 mm in the scaphotrapezial fusion site.  Good position was  obtained.  The fusion site was then packed with the cancellous bone graft.  C-arm was used in AP and lateral projections to ensure appropriate positioning and reduction of the scaphoid which was the case.  The capsule was repaired back over top of the fusion site.  A single inverted Vicryl suture was placed in the subcutaneous tissues and skin was closed with 4-0 nylon in horizontal mattress fashion.  The wound was then dressed with sterile Xeroform, 4x4s, and wrapped with Kerlix.  A thumb spica splint was placed and wrapped with Kerlix and Ace bandage.  Tourniquet was deflated at 91 minutes.  The fingertips were pink with brisk capillary refill after deflation of tourniquet.  Operative drapes were broken down and the patient was awoken from anesthesia safely.  He was transferred back to stretcher and taken to PACU in stable condition.  I will see him back in the office in 1 week for postoperative followup.  I will give him Percocet 5/325, 1-2 p.o. q.6 hours p.r.n. pain, dispensed #50.     Betha Loa, MD     KK/MEDQ  D:  08/09/2012  T:  08/10/2012  Job:  [id number]

## 2012-08-31 IMAGING — CT CT ABD-PELV W/O CM
2 of 4 series · 17 of 46 positions shown, 19 images · non-contrast
Comparison: CT abdomen and pelvis 03/29/2009 and 05/16/2005.

CLINICAL DATA: Epigastric abdominal pain.  Nausea.  History of
gastric bypass surgery.  Allergy to iodinated intravenous contrast.

CT ABDOMEN AND PELVIS WITHOUT CONTRAST 01/31/2011:
TECHNIQUE: Multidetector CT imaging of the abdomen and pelvis was
performed following the standard protocol without intravenous
contrast. Kismawati was utilized as oral contrast.

[Series 2: rtn ap without · axial · non-contrast · 0.74mm/px · z∈[-402,+28]mm · 14 of 94 slices shown, 16 images]
[im 4/94  soft-tissue]
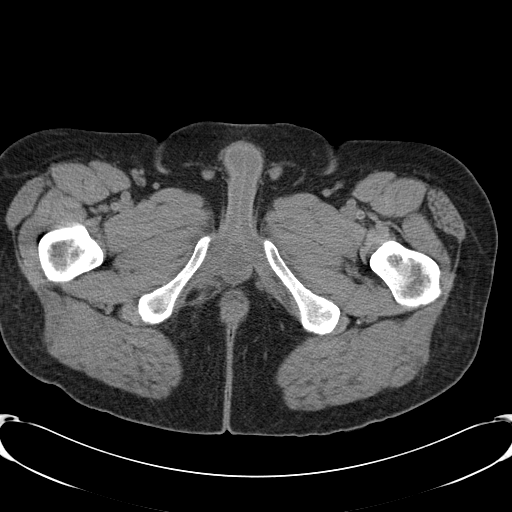
[im 4/94  bone]
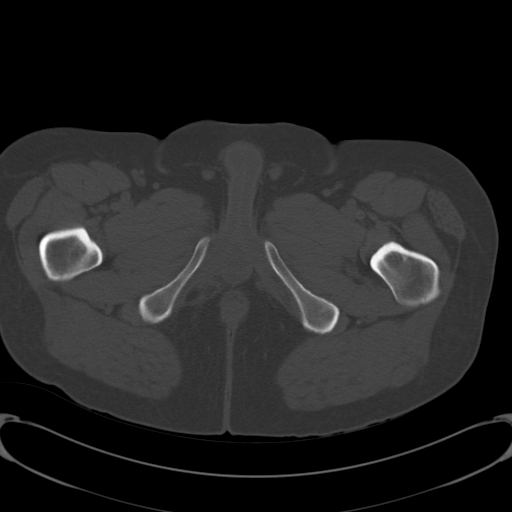
[im 12/94  soft-tissue]
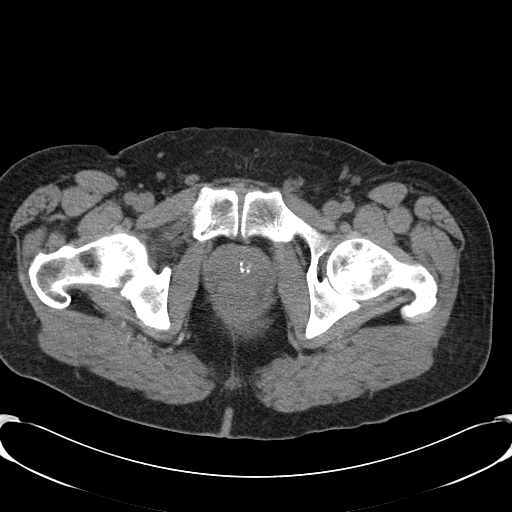
[im 19/94  soft-tissue]
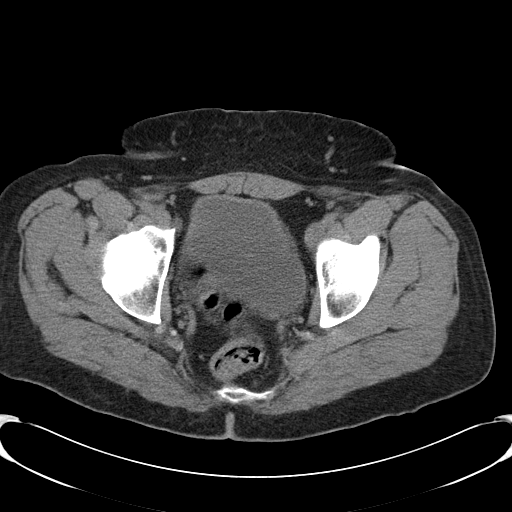
[im 27/94  soft-tissue]
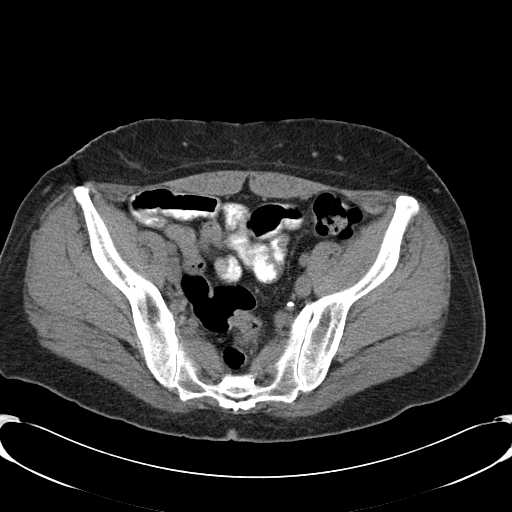
[im 30/94  soft-tissue]
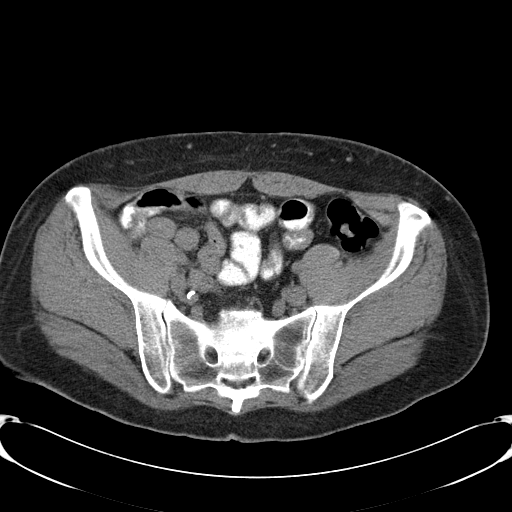
[im 38/94  soft-tissue]
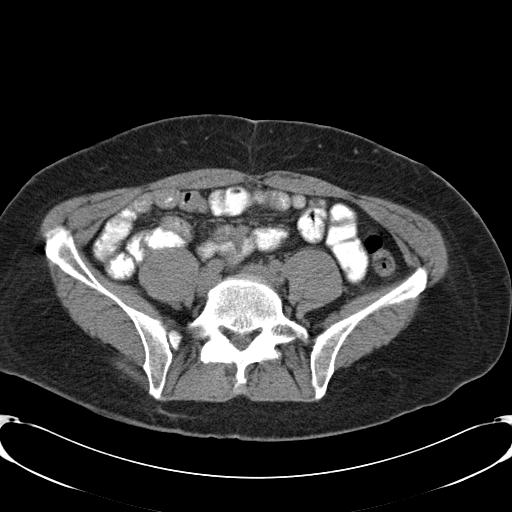
[im 45/94  soft-tissue]
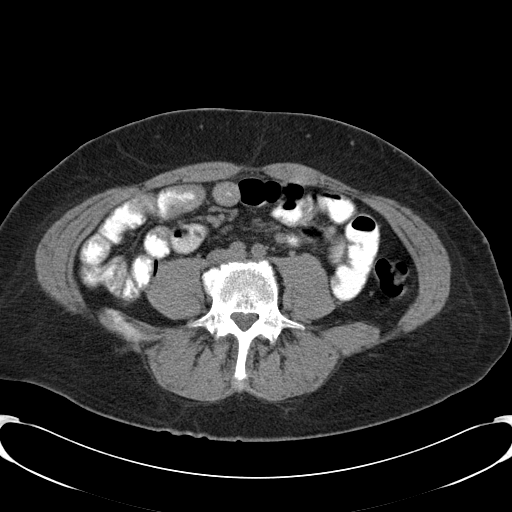
[im 49/94  soft-tissue]
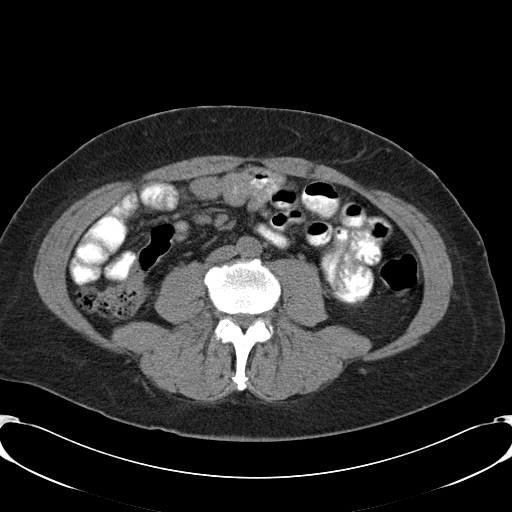
[im 56/94  soft-tissue]
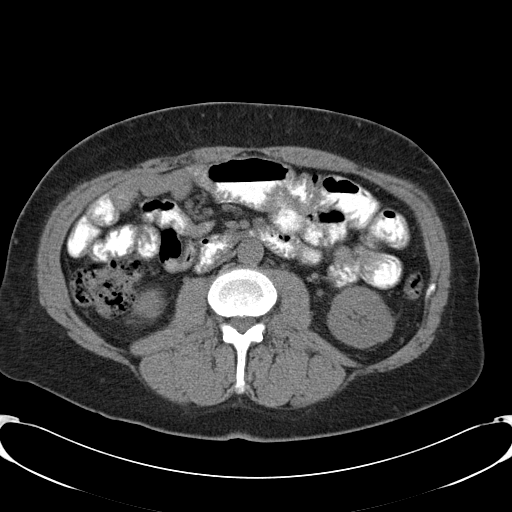
[im 56/94  bone]
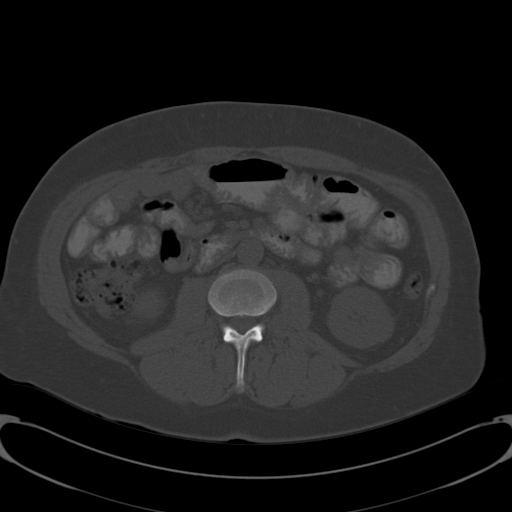
[im 64/94  soft-tissue]
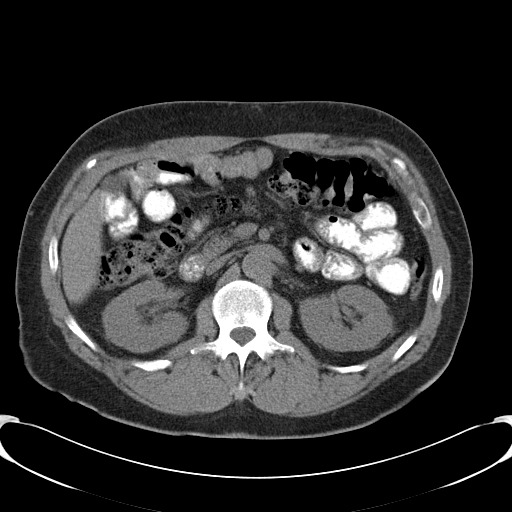
[im 71/94  soft-tissue]
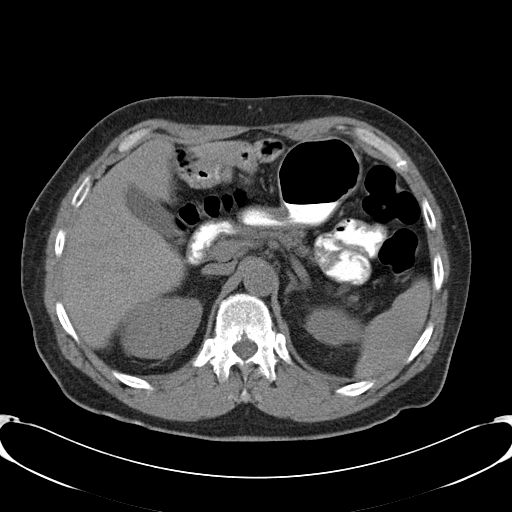
[im 75/94  soft-tissue]
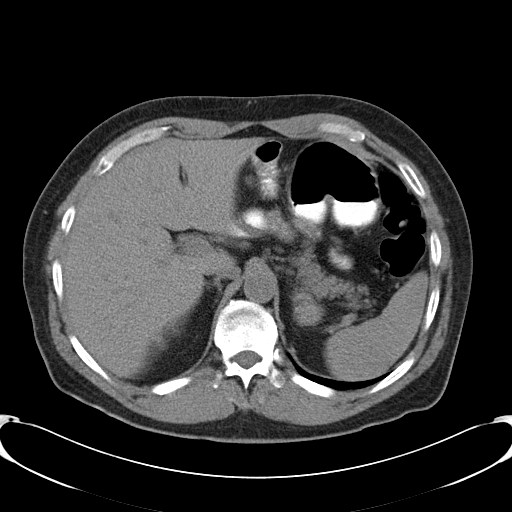
[im 82/94  soft-tissue]
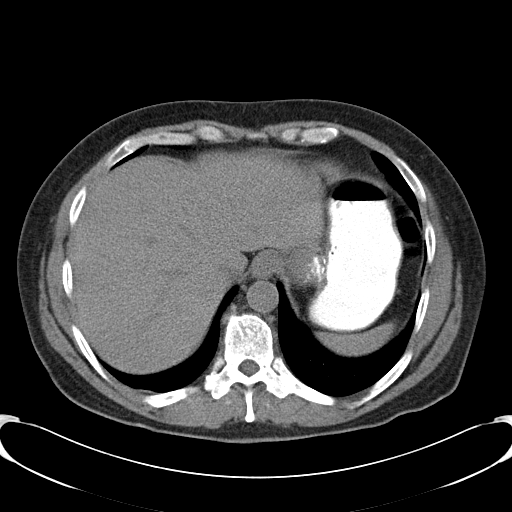
[im 90/94  soft-tissue]
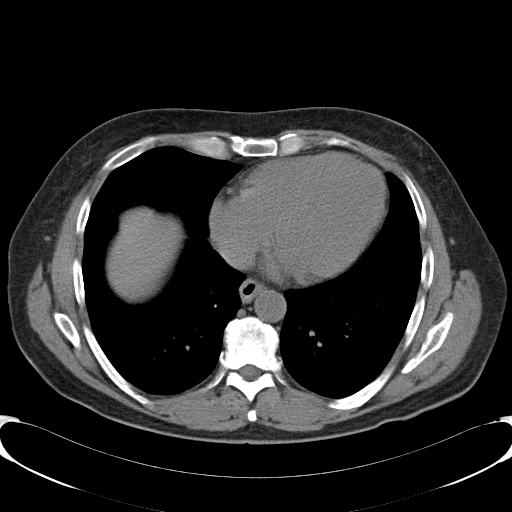

[Series 602: coronal · coronal · 0.95mm/px · 3 of 114 slices shown]
[im 38/114  soft-tissue]
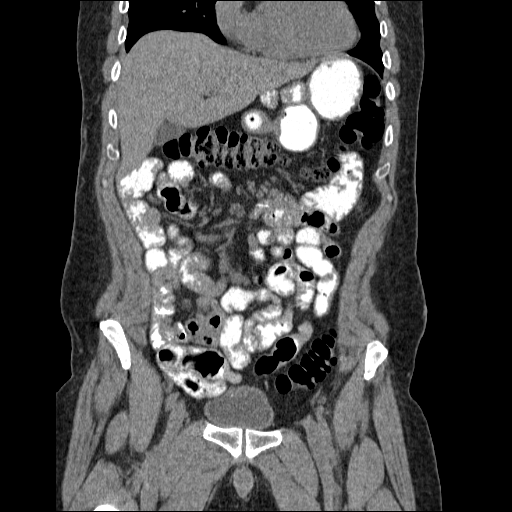
[im 51/114  soft-tissue]
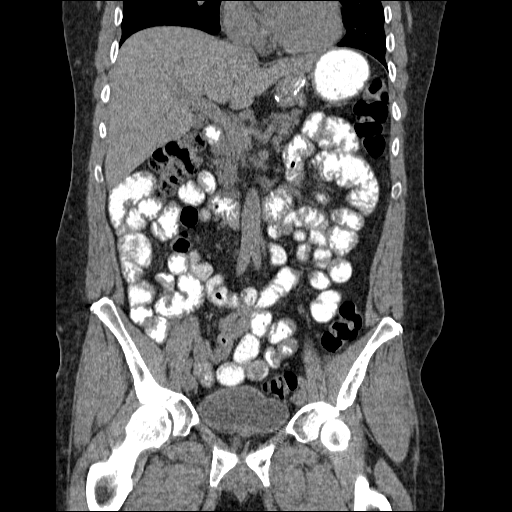
[im 63/114  soft-tissue]
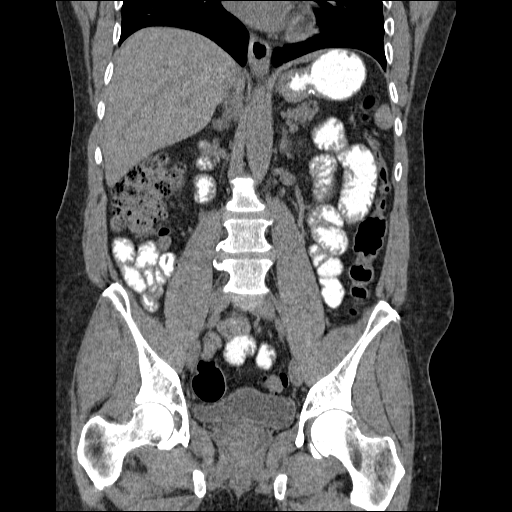

[17 of 46 positions shown; findings below may reference images not displayed]

FINDINGS: Postsurgical changes related to prior gastric bypass
procedure, with apparent dehiscence of the gastric exclusion suture
line, as the excluded stomach fills normally, as does the
gastrojejunal anastomosis.  Focal dilation of a solitary loop of
small bowel near the jejunojejunal anastomosis, as noted
previously, consistent with a focal atonic segment.  No evidence of
bowel obstruction.  Entire colon normal in appearance.  No ascites.

Normal unenhanced appearance of the liver, spleen, pancreas,
adrenal glands, and kidneys.  Gallbladder unremarkable by CT.  No
biliary ductal dilation.  Mild bilateral internal iliac
atherosclerosis.  No significant lymphadenopathy.

Urinary bladder unremarkable.  Prostate gland and seminal vesicles
normal for age.  Calcification in the vasa deferens.  Bone window
images demonstrate mild degenerative changes involving the lower
thoracic and lumbar spine.  Visualized lung bases clear.  Heart
size normal.
IMPRESSION: 1.  Prior gastric bypass with apparent focal dehiscence of the
gastric exclusion suture line, as the excluded stomach fills
normally with the oral contrast, as does the gastrojejunal
anastomosis.
2.  No acute abnormalities involving the abdomen or pelvis
otherwise.

## 2013-06-11 ENCOUNTER — Encounter (HOSPITAL_COMMUNITY): Payer: Self-pay | Admitting: *Deleted

## 2013-06-11 ENCOUNTER — Emergency Department (HOSPITAL_COMMUNITY)
Admission: EM | Admit: 2013-06-11 | Discharge: 2013-06-11 | Disposition: A | Discharge status: Home / Self Care | Payer: Self-pay | Attending: Emergency Medicine | Admitting: Emergency Medicine

## 2013-06-11 DIAGNOSIS — Z79899 Other long term (current) drug therapy: Secondary | ICD-10-CM | POA: Insufficient documentation

## 2013-06-11 DIAGNOSIS — F411 Generalized anxiety disorder: Secondary | ICD-10-CM | POA: Insufficient documentation

## 2013-06-11 DIAGNOSIS — Y9389 Activity, other specified: Secondary | ICD-10-CM | POA: Insufficient documentation

## 2013-06-11 DIAGNOSIS — Z9884 Bariatric surgery status: Secondary | ICD-10-CM | POA: Insufficient documentation

## 2013-06-11 DIAGNOSIS — M129 Arthropathy, unspecified: Secondary | ICD-10-CM | POA: Insufficient documentation

## 2013-06-11 DIAGNOSIS — Z98811 Dental restoration status: Secondary | ICD-10-CM | POA: Insufficient documentation

## 2013-06-11 DIAGNOSIS — Z7982 Long term (current) use of aspirin: Secondary | ICD-10-CM | POA: Insufficient documentation

## 2013-06-11 DIAGNOSIS — W268XXA Contact with other sharp object(s), not elsewhere classified, initial encounter: Secondary | ICD-10-CM | POA: Insufficient documentation

## 2013-06-11 DIAGNOSIS — S60459A Superficial foreign body of unspecified finger, initial encounter: Secondary | ICD-10-CM | POA: Insufficient documentation

## 2013-06-11 DIAGNOSIS — J45909 Unspecified asthma, uncomplicated: Secondary | ICD-10-CM | POA: Insufficient documentation

## 2013-06-11 DIAGNOSIS — Z880 Allergy status to penicillin: Secondary | ICD-10-CM | POA: Insufficient documentation

## 2013-06-11 DIAGNOSIS — E119 Type 2 diabetes mellitus without complications: Secondary | ICD-10-CM | POA: Insufficient documentation

## 2013-06-11 DIAGNOSIS — Y9289 Other specified places as the place of occurrence of the external cause: Secondary | ICD-10-CM | POA: Insufficient documentation

## 2013-06-11 DIAGNOSIS — Z9889 Other specified postprocedural states: Secondary | ICD-10-CM | POA: Insufficient documentation

## 2013-06-11 DIAGNOSIS — IMO0002 Reserved for concepts with insufficient information to code with codable children: Secondary | ICD-10-CM | POA: Insufficient documentation

## 2013-06-11 DIAGNOSIS — I10 Essential (primary) hypertension: Secondary | ICD-10-CM | POA: Insufficient documentation

## 2013-06-11 DIAGNOSIS — Z8719 Personal history of other diseases of the digestive system: Secondary | ICD-10-CM | POA: Insufficient documentation

## 2013-06-11 DIAGNOSIS — F17200 Nicotine dependence, unspecified, uncomplicated: Secondary | ICD-10-CM | POA: Insufficient documentation

## 2013-06-11 DIAGNOSIS — S60551A Superficial foreign body of right hand, initial encounter: Secondary | ICD-10-CM

## 2013-06-11 DIAGNOSIS — F329 Major depressive disorder, single episode, unspecified: Secondary | ICD-10-CM | POA: Insufficient documentation

## 2013-06-11 DIAGNOSIS — F3289 Other specified depressive episodes: Secondary | ICD-10-CM | POA: Insufficient documentation

## 2013-06-11 MED ORDER — PROMETHAZINE HCL 25 MG PO TABS: 25.0000 mg | ORAL_TABLET | Freq: Four times a day (QID) | ORAL | Status: AC | PRN

## 2013-06-11 MED ORDER — ONDANSETRON 8 MG PO TBDP
8.0000 mg | ORAL_TABLET | Freq: Once | ORAL | Status: AC
  Administered 2013-06-11: 8 mg via ORAL
  Filled 2013-06-11: qty 1

## 2013-06-11 MED ORDER — HYDROCODONE-ACETAMINOPHEN 5-325 MG PO TABS: 2.0000 | ORAL_TABLET | Freq: Four times a day (QID) | ORAL | Status: AC | PRN

## 2013-06-11 MED ORDER — HYDROCODONE-ACETAMINOPHEN 5-325 MG PO TABS
2.0000 | ORAL_TABLET | Freq: Once | ORAL | Status: AC
  Administered 2013-06-11: 2 via ORAL
  Filled 2013-06-11: qty 2

## 2013-06-11 NOTE — ED Provider Notes (Signed)
CSN: [id number]     Arrival date & time 06/11/13  1818 History  This chart was scribed for non-physician practitioner Junious Silk, working with Celene Kras, MD by Carl Best, ED Scribe. This patient was seen in room WTR7/WTR7 and the patient's care was started at 7:02 PM.     Chief Complaint  Patient presents with  . Finger Injury   (Consider location/radiation/quality/duration/timing/severity/associated sxs/prior Treatment) Patient is a 47 y.o. male presenting with hand injury. The history is provided by the patient. No language interpreter was used.  Hand Injury Location:  Finger Injury: yes (splinter)   Finger location:  R thumb Pain details:    Quality:  Throbbing   Radiates to:  Does not radiate   Severity:  Moderate   Onset quality:  Sudden   Timing:  Constant   Progression:  Unchanged Chronicity:  New Handedness:  Ambidextrous Dislocation: no   Tetanus status:  Up to date Prior injury to area:  No Relieved by:  Nothing Worsened by:  Movement Ineffective treatments: Morphine. Associated symptoms: decreased range of motion   Associated symptoms: no fever, no muscle weakness, no numbness and no swelling    HPI Comments: Timothy [name] is a 47 y.o. male with a history of DM who presents to the Emergency Department complaining of a throbbing hand injury in his right thumb caused by a splinter that was a quarter of an inch in diameter.  This happened earlier today. Patient stated that his wife was able to remove the splinter but the pain has not subsided since.  He stated that his last TD was a year ago.  He is concerned because he is a diabetic. No fevers, chills, nausea, vomiting, erythema, swelling.   Past Medical History  Diagnosis Date  . Diabetes mellitus   . Hypertension   . Anxiety   . Depression   . Asthma   . GERD (gastroesophageal reflux disease)   . Arthritis   . DDD (degenerative disc disease)   . Full dentures   . H/O gastric bypass   . Coronary  artery spasm     hx-no MI  . Smokes    Past Surgical History  Procedure Laterality Date  . Gastric bypass    . Carpal tunnel release    . Rotator cuff repair    . Gastric bypass  2006  . Carpal tunnel release  2006    left  . Carpal tunnel release  2010    right-DSC  . Shoulder arthroscopy  2012    right  . Shoulder arthroscopy  2005    left  . Skin graft full thickness leg  2006    lt lower leg with VAC post dog bite  . Multiple tooth extractions    . Wrist fusion  08/09/2012    Procedure: ARTHRODESIS WRIST;  Surgeon: Tami Ribas, MD;  Location: [facility];  Service: Orthopedics;  Laterality: Right;  Right Scaphoid-trapezium-trapezoid fusion, accutrak with radial bone graft      History reviewed. No pertinent family history. History  Substance Use Topics  . Smoking status: Current Every Day Smoker -- 1.00 packs/day  . Smokeless tobacco: Not on file  . Alcohol Use: No     Comment: not in 4-5 yr-hx abuse    Review of Systems  Constitutional: Negative for fever.  Musculoskeletal: Positive for myalgias.  Skin: Positive for wound (right thumb).    Allergies  Omnipaque; Ivp dye; Penicillins; Shellfish allergy; and Sulfa antibiotics  Home Medications   Current Outpatient Rx  Name  Route  Sig  Dispense  Refill  . albuterol (PROVENTIL HFA;VENTOLIN HFA) 108 (90 BASE) MCG/ACT inhaler   Inhalation   Inhale 2 puffs into the lungs every 6 (six) hours as needed.         Marland Kitchen aspirin 81 MG tablet   Oral   Take 81 mg by mouth daily.         . budesonide-formoterol (SYMBICORT) 160-4.5 MCG/ACT inhaler   Inhalation   Inhale 2 puffs into the lungs every morning.         . clonazePAM (KLONOPIN) 0.5 MG tablet   Oral   Take 0.5 mg by mouth 2 (two) times daily as needed.         . furosemide (LASIX) 40 MG tablet   Oral   Take 40 mg by mouth daily.         Marland Kitchen lisinopril (PRINIVIL,ZESTRIL) 10 MG tablet   Oral   Take 10 mg by mouth daily.          . metFORMIN (GLUCOPHAGE) 1000 MG tablet   Oral   Take 1,000 mg by mouth daily with breakfast.         . metoprolol succinate (TOPROL-XL) 100 MG 24 hr tablet   Oral   Take 100 mg by mouth daily. Take with or immediately following a meal.         . montelukast (SINGULAIR) 10 MG tablet   Oral   Take 10 mg by mouth every morning.         Marland Kitchen morphine (MSIR) 30 MG tablet   Oral   Take 30 mg by mouth every 6 (six) hours as needed.         . saxagliptin HCl (ONGLYZA) 2.5 MG TABS tablet   Oral   Take by mouth daily.         . sertraline (ZOLOFT) 50 MG tablet   Oral   Take 50 mg by mouth daily.         . simvastatin (ZOCOR) 20 MG tablet   Oral   Take 20 mg by mouth daily.           Triage Vitals: BP 137/82  Pulse 85  Temp(Src) 98.8 F (37.1 C) (Oral)  Resp 16  SpO2 96%  Physical Exam  Nursing note and vitals reviewed. Constitutional: He is oriented to person, place, and time. He appears well-developed and well-nourished. No distress.  HENT:  Head: Normocephalic and atraumatic.  Right Ear: External ear normal.  Left Ear: External ear normal.  Nose: Nose normal.  Eyes: Conjunctivae are normal.  Neck: Normal range of motion. No tracheal deviation present.  Cardiovascular: Normal rate, regular rhythm and normal heart sounds.   Cap refill <3 seconds in all fingers. Sensation intact.   Pulmonary/Chest: Effort normal and breath sounds normal. No stridor.  Abdominal: Soft. He exhibits no distension. There is no tenderness.  Musculoskeletal: He exhibits tenderness (right thumb).  Tender to palpation over right thumb. No foreign bodies palpated.  Neurological: He is alert and oriented to person, place, and time.  Skin: Skin is warm and dry. He is not diaphoretic.  Psychiatric: He has a normal mood and affect. His behavior is normal.    ED Course  Procedures (including critical care time)  DIAGNOSTIC STUDIES: Oxygen Saturation is 96% on room air, normal by my  interpretation.    COORDINATION OF CARE: 7:03 PM- Discussed obtaining an x-ray of the  hand and giving patient medication for pain and patient agreed to treatment plan.    Labs Review Labs Reviewed - No data to display Imaging Review No results found.  MDM   1. Foreign body in hand, right, initial encounter    Patient presents with a foreign body in his right hand. The patient was ultrasound and by Dr. Jodi Mourning. He visualized a very small piece of wood. At this time we believe that removing cause more damage than good. No antibiotics indicated at this time, but should return instructions were given. Vital signs stable for discharge. Patient / Family / Caregiver informed of clinical course, understand medical decision-making process, and agree with plan.   I personally performed the services described in this documentation, which was scribed in my presence. The recorded information has been reviewed and is accurate.     Mora Bellman, PA-C 06/20/13 1702

## 2013-06-11 NOTE — ED Provider Notes (Signed)
Splinter incident PTA.  Concern for retained piece.  No evidence of infection.  Indication - foreign body assessment Linear probe utilized in 2 planes. One small 2mm hyperechoic area likely splinter piece. Images saved  Discussed r/b of removal, pt will fup outpt.  Enid Skeens 7:39 PM   Enid Skeens, MD 06/11/13 [phone]

## 2013-06-11 NOTE — ED Notes (Signed)
Pt reports he was doing yardwork. Got a 1inch splint stuck into the skin between his thumb and index finger. Reports he pulled out splinter, but wants to make sure there is no wood left in his finger. Pain 7/10.

## 2013-06-13 ENCOUNTER — Emergency Department (HOSPITAL_COMMUNITY)
Admission: EM | Admit: 2013-06-13 | Discharge: 2013-06-14 | Disposition: A | Discharge status: Home / Self Care | Payer: Self-pay | Attending: Emergency Medicine | Admitting: Emergency Medicine

## 2013-06-13 DIAGNOSIS — I10 Essential (primary) hypertension: Secondary | ICD-10-CM | POA: Insufficient documentation

## 2013-06-13 DIAGNOSIS — Z79899 Other long term (current) drug therapy: Secondary | ICD-10-CM | POA: Insufficient documentation

## 2013-06-13 DIAGNOSIS — Z48817 Encounter for surgical aftercare following surgery on the skin and subcutaneous tissue: Secondary | ICD-10-CM | POA: Insufficient documentation

## 2013-06-13 DIAGNOSIS — F17200 Nicotine dependence, unspecified, uncomplicated: Secondary | ICD-10-CM | POA: Insufficient documentation

## 2013-06-13 DIAGNOSIS — E119 Type 2 diabetes mellitus without complications: Secondary | ICD-10-CM | POA: Insufficient documentation

## 2013-06-13 DIAGNOSIS — IMO0002 Reserved for concepts with insufficient information to code with codable children: Secondary | ICD-10-CM | POA: Insufficient documentation

## 2013-06-13 DIAGNOSIS — Z9884 Bariatric surgery status: Secondary | ICD-10-CM | POA: Insufficient documentation

## 2013-06-13 DIAGNOSIS — F329 Major depressive disorder, single episode, unspecified: Secondary | ICD-10-CM | POA: Insufficient documentation

## 2013-06-13 DIAGNOSIS — F3289 Other specified depressive episodes: Secondary | ICD-10-CM | POA: Insufficient documentation

## 2013-06-13 DIAGNOSIS — Z5189 Encounter for other specified aftercare: Secondary | ICD-10-CM

## 2013-06-13 DIAGNOSIS — Z98811 Dental restoration status: Secondary | ICD-10-CM | POA: Insufficient documentation

## 2013-06-13 DIAGNOSIS — J45909 Unspecified asthma, uncomplicated: Secondary | ICD-10-CM | POA: Insufficient documentation

## 2013-06-13 DIAGNOSIS — F411 Generalized anxiety disorder: Secondary | ICD-10-CM | POA: Insufficient documentation

## 2013-06-13 DIAGNOSIS — Z880 Allergy status to penicillin: Secondary | ICD-10-CM | POA: Insufficient documentation

## 2013-06-13 DIAGNOSIS — K219 Gastro-esophageal reflux disease without esophagitis: Secondary | ICD-10-CM | POA: Insufficient documentation

## 2013-06-13 DIAGNOSIS — Z7982 Long term (current) use of aspirin: Secondary | ICD-10-CM | POA: Insufficient documentation

## 2013-06-13 DIAGNOSIS — M129 Arthropathy, unspecified: Secondary | ICD-10-CM | POA: Insufficient documentation

## 2013-06-14 ENCOUNTER — Encounter (HOSPITAL_COMMUNITY): Payer: Self-pay | Admitting: Emergency Medicine

## 2013-06-14 MED ORDER — CEPHALEXIN 500 MG PO CAPS: 500.0000 mg | ORAL_CAPSULE | Freq: Four times a day (QID) | ORAL | Status: DC

## 2013-06-14 MED ORDER — DOXYCYCLINE HYCLATE 100 MG PO CAPS: 100.0000 mg | ORAL_CAPSULE | Freq: Two times a day (BID) | ORAL | Status: AC

## 2013-06-14 NOTE — ED Provider Notes (Signed)
CSN: [id number]     Arrival date & time 06/13/13  2345 History   First MD Initiated Contact with Patient 06/14/13 0037     Chief Complaint  Patient presents with  . Foreign body to hand    (Consider location/radiation/quality/duration/timing/severity/associated sxs/prior Treatment) HPI  Timothy [name] is a 47 y.o. male with past medical history significant for non-insulin-dependent diabetes complaining of increasing pain and discharge from left hand. Patient was seen for splinter to left hand several days ago. His wife has removed it at home. Patient states her O. was a white substance that we doubt after he was pushing on it earlier in the day. Patient denies any fever, nausea vomiting, increasing pain, increasing redness.   Past Medical History  Diagnosis Date  . Diabetes mellitus   . Hypertension   . Anxiety   . Depression   . Asthma   . GERD (gastroesophageal reflux disease)   . Arthritis   . DDD (degenerative disc disease)   . Full dentures   . H/O gastric bypass   . Smokes    Past Surgical History  Procedure Laterality Date  . Gastric bypass    . Carpal tunnel release    . Rotator cuff repair    . Gastric bypass  2006  . Carpal tunnel release  2006    left  . Carpal tunnel release  2010    right-DSC  . Shoulder arthroscopy  2012    right  . Shoulder arthroscopy  2005    left  . Skin graft full thickness leg  2006    lt lower leg with VAC post dog bite  . Multiple tooth extractions    . Wrist fusion  08/09/2012    Procedure: ARTHRODESIS WRIST;  Surgeon: Tami Ribas, MD;  Location: [facility];  Service: Orthopedics;  Laterality: Right;  Right Scaphoid-trapezium-trapezoid fusion, accutrak with radial bone graft      History reviewed. No pertinent family history. History  Substance Use Topics  . Smoking status: Current Every Day Smoker -- 1.00 packs/day  . Smokeless tobacco: Not on file  . Alcohol Use: No     Comment: not in 4-5 yr-hx abuse     Review of Systems  Allergies  Ivp dye; Omnipaque; Penicillins; Sulfa antibiotics; and Shellfish allergy  Home Medications   Current Outpatient Rx  Name  Route  Sig  Dispense  Refill  . albuterol (PROVENTIL HFA;VENTOLIN HFA) 108 (90 BASE) MCG/ACT inhaler   Inhalation   Inhale 2 puffs into the lungs every 6 (six) hours as needed.         Marland Kitchen aspirin 81 MG tablet   Oral   Take 81 mg by mouth daily.         . budesonide-formoterol (SYMBICORT) 160-4.5 MCG/ACT inhaler   Inhalation   Inhale 2 puffs into the lungs every morning.         . clonazePAM (KLONOPIN) 0.5 MG tablet   Oral   Take 0.5 mg by mouth 2 (two) times daily as needed for anxiety.          . furosemide (LASIX) 40 MG tablet   Oral   Take 40 mg by mouth daily.         Marland Kitchen HYDROcodone-acetaminophen (NORCO/VICODIN) 5-325 MG per tablet   Oral   Take 2 tablets by mouth every 6 (six) hours as needed for pain.   12 tablet   0   . lisinopril (PRINIVIL,ZESTRIL) 10 MG tablet  Oral   Take 10 mg by mouth daily.         . metFORMIN (GLUCOPHAGE) 1000 MG tablet   Oral   Take 1,000 mg by mouth daily with breakfast.         . metoprolol succinate (TOPROL-XL) 100 MG 24 hr tablet   Oral   Take 100 mg by mouth daily. Take with or immediately following a meal.         . montelukast (SINGULAIR) 10 MG tablet   Oral   Take 10 mg by mouth every morning.         Marland Kitchen morphine (MSIR) 30 MG tablet   Oral   Take 30 mg by mouth every 6 (six) hours as needed for pain.          . promethazine (PHENERGAN) 25 MG tablet   Oral   Take 1 tablet (25 mg total) by mouth every 6 (six) hours as needed for nausea.   12 tablet   0   . saxagliptin HCl (ONGLYZA) 2.5 MG TABS tablet   Oral   Take by mouth daily.         . sertraline (ZOLOFT) 50 MG tablet   Oral   Take 50 mg by mouth daily.         . simvastatin (ZOCOR) 20 MG tablet   Oral   Take 20 mg by mouth daily.          Marland Kitchen doxycycline (VIBRAMYCIN) 100 MG  capsule   Oral   Take 1 capsule (100 mg total) by mouth 2 (two) times daily. One po bid x 7 days   14 capsule   0    BP 144/80  Pulse 74  Temp(Src) 98 F (36.7 C) (Oral)  Resp 20  SpO2 100% Physical Exam  Nursing note and vitals reviewed. Constitutional: He is oriented to person, place, and time. He appears well-developed and well-nourished. No distress.  HENT:  Head: Normocephalic.  Mouth/Throat: Oropharynx is clear and moist.  Eyes: Conjunctivae and EOM are normal. Pupils are equal, round, and reactive to light.  Neck: Normal range of motion.  Cardiovascular: Normal rate.   Pulmonary/Chest: Effort normal and breath sounds normal. No stridor. No respiratory distress. He has no wheezes. He has no rales. He exhibits no tenderness.  Abdominal: Soft. Bowel sounds are normal.  Musculoskeletal: Normal range of motion.       Hands: Neurological: He is alert and oriented to person, place, and time.  Psychiatric: He has a normal mood and affect.    ED Course  Procedures (including critical care time) Labs Review Labs Reviewed - No data to display Imaging Review No results found.  MDM   1. Visit for wound check    Filed Vitals:   06/13/13 2354  BP: 144/80  Pulse: 74  Temp: 98 F (36.7 C)  TempSrc: Oral  Resp: 20  SpO2: 100%     Timothy [name] is a 47 y.o. male presenting for wound check to puncture wound with likely retained foreign body, organic in nature wooden splinter occurring 2 days ago. Patient states that there was purulent drainage from the wound earlier in the day. On my exam there is no drainage and no signs of infection. Patient is diabetic, for this reason I will start him on antibiotics as he had anaphylactic reaction to penicillin and he is sulfa allergic I will give him doxy twice a day. I've asked him to follow with his primary care Dr.  for a wound check within the next 48 hours. Return precautions for worsening infection discussed.  Pt is  hemodynamically stable, appropriate for, and amenable to discharge at this time. Pt verbalized understanding and agrees with care plan. All questions answered. Outpatient follow-up and specific return precautions discussed.    New Prescriptions   DOXYCYCLINE (VIBRAMYCIN) 100 MG CAPSULE    Take 1 capsule (100 mg total) by mouth 2 (two) times daily. One po bid x 7 days    Note: Portions of this report may have been transcribed using voice recognition software. Every effort was made to ensure accuracy; however, inadvertent computerized transcription errors may be present      Wynetta Emery, PA-C 06/14/13 [phone]

## 2013-06-14 NOTE — ED Notes (Signed)
Pt reported that he was seen here for a stick stuck in his hand, was told to return for signs of infection, redness and swelling noted to area and pt reported white substance leaking from hand

## 2013-06-14 NOTE — ED Provider Notes (Signed)
Medical screening examination/treatment/procedure(s) were performed by non-physician practitioner and as supervising physician I was immediately available for consultation/collaboration.   Lyanne Co, MD 06/14/13 [phone]

## 2013-06-21 NOTE — ED Provider Notes (Signed)
Medical screening examination/treatment/procedure(s) were performed by non-physician practitioner and as supervising physician I was immediately available for consultation/collaboration.    Nneka Blanda R Sage Hammill, MD 06/21/13 1633 

## 2017-01-04 ENCOUNTER — Emergency Department (HOSPITAL_COMMUNITY): Payer: BLUE CROSS/BLUE SHIELD

## 2017-01-04 ENCOUNTER — Encounter (HOSPITAL_COMMUNITY): Payer: Self-pay | Admitting: Emergency Medicine

## 2017-01-04 DIAGNOSIS — J45909 Unspecified asthma, uncomplicated: Secondary | ICD-10-CM | POA: Diagnosis not present

## 2017-01-04 DIAGNOSIS — Z79899 Other long term (current) drug therapy: Secondary | ICD-10-CM | POA: Diagnosis not present

## 2017-01-04 DIAGNOSIS — E119 Type 2 diabetes mellitus without complications: Secondary | ICD-10-CM | POA: Insufficient documentation

## 2017-01-04 DIAGNOSIS — Z7982 Long term (current) use of aspirin: Secondary | ICD-10-CM | POA: Diagnosis not present

## 2017-01-04 DIAGNOSIS — Z7984 Long term (current) use of oral hypoglycemic drugs: Secondary | ICD-10-CM | POA: Insufficient documentation

## 2017-01-04 DIAGNOSIS — Y9389 Activity, other specified: Secondary | ICD-10-CM | POA: Diagnosis not present

## 2017-01-04 DIAGNOSIS — Y929 Unspecified place or not applicable: Secondary | ICD-10-CM | POA: Insufficient documentation

## 2017-01-04 DIAGNOSIS — M25531 Pain in right wrist: Secondary | ICD-10-CM | POA: Diagnosis present

## 2017-01-04 DIAGNOSIS — I10 Essential (primary) hypertension: Secondary | ICD-10-CM | POA: Insufficient documentation

## 2017-01-04 DIAGNOSIS — Y999 Unspecified external cause status: Secondary | ICD-10-CM | POA: Insufficient documentation

## 2017-01-04 DIAGNOSIS — F17200 Nicotine dependence, unspecified, uncomplicated: Secondary | ICD-10-CM | POA: Insufficient documentation

## 2017-01-04 DIAGNOSIS — X501XXA Overexertion from prolonged static or awkward postures, initial encounter: Secondary | ICD-10-CM | POA: Diagnosis not present

## 2017-01-04 NOTE — ED Triage Notes (Signed)
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

## 2017-01-05 ENCOUNTER — Emergency Department (HOSPITAL_COMMUNITY)
Admission: EM | Admit: 2017-01-05 | Discharge: 2017-01-05 | Disposition: A | Discharge status: Home / Self Care | Payer: BLUE CROSS/BLUE SHIELD | Attending: Emergency Medicine | Admitting: Emergency Medicine

## 2017-01-05 DIAGNOSIS — M25531 Pain in right wrist: Secondary | ICD-10-CM

## 2017-01-05 NOTE — ED Notes (Signed)
Patient states he has his own wrist splint at home.

## 2017-01-05 NOTE — Discharge Instructions (Signed)
Your x-ray is negative for acute finding. Wear your splint and follow-up with Dr. Merlyn Lot. Return to the ED if you develop new or worsening symptoms.

## 2017-01-05 NOTE — ED Provider Notes (Signed)
[facility]-EMERGENCY DEPT Provider Note   CSN: [id number] Arrival date & time: 01/04/17  2325   By signing my name below, I, Clarisse Gouge, attest that this documentation has been prepared under the direction and in the presence of Glynn Octave, MD. Electronically signed, Clarisse Gouge, ED Scribe. 01/05/17. 1:54 AM.   History   Chief Complaint Chief Complaint  Patient presents with  . Wrist Injury   The history is provided by the patient and medical records. No language interpreter was used.    HPI Comments: Timothy [name] is a 51 y.o. male with Hx of DM, asthma, 2 ruptured discs in the neck, arthritis and bilateral carpal tunnel surgeries who presents to the Emergency Department complaining of top R wrist pain after an accident with a drill that occurred last night. He states his hand got caught in a drill that twisted his wrist. No pain noted before the injury. Pt notes associated nausea and tingling. He states some necrosis to the R and L hand d/t arthritis. He is reportedly followed at the [facility] for arthritis and carpal tunnel in the hands. Pt reportedly on prescribed hydrocodone and morphine at home regularly. Pt denies pain in the abdomen or chest.  Past Medical History:  Diagnosis Date  . Anxiety   . Arthritis   . Asthma   . DDD (degenerative disc disease)   . Depression   . Diabetes mellitus   . Full dentures   . GERD (gastroesophageal reflux disease)   . H/O gastric bypass   . Hypertension   . Smokes     There are no active problems to display for this patient.   Past Surgical History:  Procedure Laterality Date  . CARPAL TUNNEL RELEASE    . CARPAL TUNNEL RELEASE  2006   left  . CARPAL TUNNEL RELEASE  2010   right-DSC  . GASTRIC BYPASS    . GASTRIC BYPASS  2006  . MULTIPLE TOOTH EXTRACTIONS    . ROTATOR CUFF REPAIR    . SHOULDER ARTHROSCOPY  2012   right  . SHOULDER ARTHROSCOPY  2005   left  . SKIN GRAFT FULL THICKNESS LEG  2006   lt lower leg with  VAC post dog bite  . WRIST FUSION  08/09/2012   Procedure: ARTHRODESIS WRIST;  Surgeon: Tami Ribas, MD;  Location: [facility];  Service: Orthopedics;  Laterality: Right;  Right Scaphoid-trapezium-trapezoid fusion, accutrak with radial bone graft          Home Medications    Prior to Admission medications   Medication Sig Start Date End Date Taking? Authorizing Provider  albuterol (PROVENTIL HFA;VENTOLIN HFA) 108 (90 BASE) MCG/ACT inhaler Inhale 2 puffs into the lungs every 6 (six) hours as needed.    Historical Provider, MD  aspirin 81 MG tablet Take 81 mg by mouth daily.    Historical Provider, MD  budesonide-formoterol (SYMBICORT) 160-4.5 MCG/ACT inhaler Inhale 2 puffs into the lungs every morning.    Historical Provider, MD  clonazePAM (KLONOPIN) 0.5 MG tablet Take 0.5 mg by mouth 2 (two) times daily as needed for anxiety.     Historical Provider, MD  doxycycline (VIBRAMYCIN) 100 MG capsule Take 1 capsule (100 mg total) by mouth 2 (two) times daily. One po bid x 7 days 06/14/13   Joni Reining Pisciotta, PA-C  furosemide (LASIX) 40 MG tablet Take 40 mg by mouth daily.    Historical Provider, MD  HYDROcodone-acetaminophen (NORCO/VICODIN) 5-325 MG per tablet Take 2 tablets by  mouth every 6 (six) hours as needed for pain. 06/11/13   Junious Silk, PA-C  lisinopril (PRINIVIL,ZESTRIL) 10 MG tablet Take 10 mg by mouth daily.    Historical Provider, MD  metFORMIN (GLUCOPHAGE) 1000 MG tablet Take 1,000 mg by mouth daily with breakfast.    Historical Provider, MD  metoprolol succinate (TOPROL-XL) 100 MG 24 hr tablet Take 100 mg by mouth daily. Take with or immediately following a meal.    Historical Provider, MD  montelukast (SINGULAIR) 10 MG tablet Take 10 mg by mouth every morning.    Historical Provider, MD  morphine (MSIR) 30 MG tablet Take 30 mg by mouth every 6 (six) hours as needed for pain.     Historical Provider, MD  promethazine (PHENERGAN) 25 MG tablet Take 1 tablet (25 mg  total) by mouth every 6 (six) hours as needed for nausea. 06/11/13   Junious Silk, PA-C  saxagliptin HCl (ONGLYZA) 2.5 MG TABS tablet Take by mouth daily.    Historical Provider, MD  sertraline (ZOLOFT) 50 MG tablet Take 50 mg by mouth daily.    Historical Provider, MD  simvastatin (ZOCOR) 20 MG tablet Take 20 mg by mouth daily.     Historical Provider, MD    Family History History reviewed. No pertinent family history.  Social History Social History  Substance Use Topics  . Smoking status: Current Every Day Smoker    Packs/day: 1.00  . Smokeless tobacco: Never Used  . Alcohol use No     Comment: not in 4-5 yr-hx abuse     Allergies   Ivp dye [iodinated diagnostic agents]; Omnipaque [iohexol]; Penicillins; Sulfa antibiotics; and Shellfish allergy   Review of Systems Review of Systems  Cardiovascular: Negative for chest pain.  Gastrointestinal: Positive for nausea. Negative for abdominal pain and vomiting.  Musculoskeletal: Positive for arthralgias. Negative for joint swelling and myalgias.  Skin: Negative for wound.  Neurological: Positive for numbness.  All other systems reviewed and are negative.    Physical Exam Updated Vital Signs BP (!) 169/89 (BP Location: Left Arm)   Pulse 87   Temp 97.9 F (36.6 C) (Oral)   Resp 18   SpO2 98%   Physical Exam  Constitutional: He is oriented to person, place, and time. He appears well-developed and well-nourished. No distress.  HENT:  Head: Normocephalic and atraumatic.  Mouth/Throat: Oropharynx is clear and moist. No oropharyngeal exudate.  Eyes: Conjunctivae and EOM are normal. Pupils are equal, round, and reactive to light.  Neck: Normal range of motion. Neck supple.  No meningismus.  Cardiovascular: Normal rate, regular rhythm, normal heart sounds and intact distal pulses.   No murmur heard. Pulmonary/Chest: Effort normal and breath sounds normal. No respiratory distress.  Abdominal: Soft. There is no tenderness. There  is no rebound and no guarding.  Musculoskeletal: Normal range of motion. He exhibits tenderness. He exhibits no edema.  Tenderness to the L wrist on the dorsal surface, ulnar side; pain with ROM; intact radial pulses and cardinal hand movements; no snuff box tenderness  Neurological: He is alert and oriented to person, place, and time. No cranial nerve deficit. He exhibits normal muscle tone. Coordination normal.   5/5 strength throughout. CN 2-12 intact.Equal grip strength.   Skin: Skin is warm.  Psychiatric: He has a normal mood and affect. His behavior is normal.  Nursing note and vitals reviewed.   ED Treatments / Results  DIAGNOSTIC STUDIES: Oxygen Saturation is 98% on RA, normal by my interpretation.    COORDINATION  OF CARE: 1:49 AM Discussed treatment plan with pt at bedside and pt agreed to plan. Pt prepared for discharge and advised of symptomatic care at home. Pt offered a splint and pain medications, but he refused because he has a splint and adequate pain medications at home.  Labs (all labs ordered are listed, but only abnormal results are displayed) Labs Reviewed - No data to display  EKG  EKG Interpretation None       Radiology Dg Wrist Complete Right  Result Date: 01/05/2017 CLINICAL DATA:  Persistent pain and swelling tonight after working with a drill EXAM: RIGHT WRIST - COMPLETE 3+ VIEW COMPARISON:  None. FINDINGS: Negative for acute fracture. There is flattening and sclerosis of the lunate consistent with the described history of osteonecrosis. There is instrumented fusion at the triscaphe articulations, appearing solidly ossified. There are degenerative changes at the distal radioulnar joint which have worsened from 05/08/2012. IMPRESSION: Severe chronic changes. Negative for fracture or other acute abnormality. Electronically Signed   By: Ellery Plunk M.D.   On: 01/05/2017 00:01    Procedures Procedures (including critical care time)  Medications Ordered  in ED Medications - No data to display   Initial Impression / Assessment and Plan / ED Course  I have reviewed the triage vital signs and the nursing notes.  Pertinent labs & imaging results that were available during my care of the patient were reviewed by me and considered in my medical decision making (see chart for details).    Patient with pain in right wrist after twisting it while using a drill tonight. History of osteonecrosis and same wrist. States he felt a cracking noise. Denies any fever or vomiting.  Neurovascularly intact. X-ray performed in triage shows chronic sclerotic changes with no fracture or acute finding.  Patient is treated with anti-inflammatories he is on chronic pain medication at home. He also states he has a wrist splint at home. Follow up with his hand surgeon. Return precautions discussed.  Final Clinical Impressions(s) / ED Diagnoses   Final diagnoses:  Right wrist pain    New Prescriptions New Prescriptions   No medications on file   I personally performed the services described in this documentation, which was scribed in my presence. The recorded information has been reviewed and is accurate.    Glynn Octave, MD 01/05/17 [phone]

## 2017-01-05 NOTE — ED Notes (Signed)
Patient verbalizes understanding of discharge instructions, home care and follow up care if needed. Patient out of department at this time with family. 

## 2017-10-28 ENCOUNTER — Encounter (HOSPITAL_COMMUNITY): Payer: Self-pay | Admitting: Emergency Medicine

## 2017-10-28 DIAGNOSIS — H538 Other visual disturbances: Secondary | ICD-10-CM | POA: Diagnosis not present

## 2017-10-28 DIAGNOSIS — Z5321 Procedure and treatment not carried out due to patient leaving prior to being seen by health care provider: Secondary | ICD-10-CM | POA: Insufficient documentation

## 2017-10-28 NOTE — ED Triage Notes (Signed)
Pt was using a wrench while under a cabinet at home. Wrench slipped and fell on his face leaving a small laceration above left eye. Bleeding has stopped. Originally had a hematoma that has since gone done.  Reports his vision is blurry in both eyes from diabetes he reports.

## 2017-10-29 ENCOUNTER — Emergency Department (HOSPITAL_COMMUNITY)
Admission: EM | Admit: 2017-10-29 | Discharge: 2017-10-29 | Disposition: A | Discharge status: Home / Self Care | Payer: BLUE CROSS/BLUE SHIELD | Attending: Emergency Medicine | Admitting: Emergency Medicine

## 2017-10-29 NOTE — ED Notes (Signed)
Called for second time  No response from lobby 

## 2017-10-29 NOTE — ED Notes (Signed)
Last call made for patient to be roomed. No response from lobby.

## 2017-10-29 NOTE — ED Notes (Signed)
Pt called  No response from lobby  

## 2022-05-05 DEATH — deceased
